# Patient Record
Sex: Female | Born: 1995 | Race: White | Hispanic: No | Marital: Married | Smoking: Never smoker
Health system: Southern US, Community
[De-identification: ages and names within clinical notes are randomized; demographics above are authoritative.]

## PROBLEM LIST (undated history)

## (undated) ENCOUNTER — Inpatient Hospital Stay: Payer: Self-pay

## (undated) DIAGNOSIS — K5900 Constipation, unspecified: Secondary | ICD-10-CM

## (undated) HISTORY — PX: KNEE SURGERY: SHX244

## (undated) HISTORY — PX: CHOLECYSTECTOMY: SHX55

---
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
## 2017-02-22 NOTE — L&D Delivery Note (Signed)
Delivery Note  First Stage: Induction: Cytotec, Cook Cath, Pitocin, AROM Analgesia /Anesthesia intrapartum: epidural AROM at 0105  Second Stage: Complete dilation at 1123 Onset of pushing at 1123 FHR second stage Cat II, occasional variable decels  Delivery of a viable female at 1219 on 10/25/2017  by CNM delivery of fetal head in ROA position with restitution to ROT. No nuchal cord;  Anterior then posterior shoulders delivered easily with gentle downward traction. Baby placed on mom's chest, and attended to by peds.  Cord double clamped after cessation of pulsation, cut by FOB   Third Stage: Placenta delivered Spontaneously intact with 3VC @ 1230 Placenta disposition: routine disposal Uterine tone: firm with massage, bleeding small. I/O cath for 41ml urine and Cytotec PR given due to increased PPH risk with IOL.   1st deg perineal and right periurethral laceration identified  Anesthesia for repair: epidural Repair: 2-0 vicryl CT-1 and 3-0 vicryl SH Est. Blood Loss (mL): 250  Complications: none  Mom to postpartum.  Baby to Couplet care / Skin to Skin.  Newborn: Birth Weight:  7 lb 1.9 oz  Apgar Scores: 7/9 Feeding planned: breast and formula

## 2017-04-06 ENCOUNTER — Emergency Department: Payer: BC Managed Care – PPO

## 2017-04-06 ENCOUNTER — Other Ambulatory Visit: Payer: Self-pay

## 2017-04-06 ENCOUNTER — Emergency Department
Admission: EM | Admit: 2017-04-06 | Discharge: 2017-04-06 | Disposition: A | Discharge status: Home / Self Care | Payer: BC Managed Care – PPO | Attending: Emergency Medicine | Admitting: Emergency Medicine

## 2017-04-06 ENCOUNTER — Encounter: Payer: Self-pay | Admitting: Emergency Medicine

## 2017-04-06 DIAGNOSIS — S80811A Abrasion, right lower leg, initial encounter: Secondary | ICD-10-CM | POA: Insufficient documentation

## 2017-04-06 DIAGNOSIS — Y929 Unspecified place or not applicable: Secondary | ICD-10-CM | POA: Insufficient documentation

## 2017-04-06 DIAGNOSIS — S93492A Sprain of other ligament of left ankle, initial encounter: Secondary | ICD-10-CM | POA: Diagnosis not present

## 2017-04-06 DIAGNOSIS — W109XXA Fall (on) (from) unspecified stairs and steps, initial encounter: Secondary | ICD-10-CM | POA: Insufficient documentation

## 2017-04-06 DIAGNOSIS — Z3A10 10 weeks gestation of pregnancy: Secondary | ICD-10-CM | POA: Insufficient documentation

## 2017-04-06 DIAGNOSIS — Y939 Activity, unspecified: Secondary | ICD-10-CM | POA: Insufficient documentation

## 2017-04-06 DIAGNOSIS — Y998 Other external cause status: Secondary | ICD-10-CM | POA: Insufficient documentation

## 2017-04-06 DIAGNOSIS — Z880 Allergy status to penicillin: Secondary | ICD-10-CM | POA: Insufficient documentation

## 2017-04-06 DIAGNOSIS — S99912A Unspecified injury of left ankle, initial encounter: Secondary | ICD-10-CM | POA: Diagnosis present

## 2017-04-06 NOTE — ED Triage Notes (Signed)
Pt to triage via w/c with no distress noted; to walking down steps and fell injuring left ankle; abrasion noted to right lower leg; denies any other c/o; pt approx [redacted]wks pregnant

## 2017-04-06 NOTE — ED Provider Notes (Signed)
[facility] EMERGENCY DEPARTMENT Provider Note   CSN: [id number] Arrival date & time: 04/06/17  2127     History   Chief Complaint Chief Complaint  Patient presents with  . Ankle Pain    HPI Regina Baker is a 22 y.o. female presents to the emergency department for evaluation of left ankle pain.  Patient is [redacted] weeks pregnant.  She denies any injury to her abdomen back, chest head or neck.  She states she only rolled her left ankle and is only having pain in the left ankle.  She did suffer mild abrasions to the right shin but she is not having the right lower extremity discomfort.  The pain is mild she has not attempted weightbearing she has had moderate swelling throughout the left lateral ankle.  Denies any vaginal bleeding, discharge.  HPI  Past Medical History:  Diagnosis Date  . Constipation     There are no active problems to display for this patient.   Past Surgical History:  Procedure Laterality Date  . KNEE SURGERY Right     OB History    Gravida Para Term Preterm AB Living   1             SAB TAB Ectopic Multiple Live Births                   Home Medications    Prior to Admission medications   Not on File    Family History No family history on file.  Social History Social History   Tobacco Use  . Smoking status: Never Smoker  Substance Use Topics  . Alcohol use: No  . Drug use: Not on file     Allergies   Penicillins   Review of Systems Review of Systems  Constitutional: Negative.   Cardiovascular: Negative for chest pain and leg swelling.  Gastrointestinal: Negative for abdominal pain.  Musculoskeletal: Positive for gait problem and joint swelling. Negative for back pain and neck pain.  Skin: Negative for color change, rash and wound.  Neurological: Negative for dizziness, syncope and weakness.  Psychiatric/Behavioral: Negative for confusion and hallucinations.  All other systems reviewed and are  negative.    Physical Exam Updated Vital Signs BP 138/80 (BP Location: Left Arm)   Pulse 86   Temp 98 F (36.7 C) (Oral)   Resp 18   Ht 5\' 7"  (1.702 m)   Wt 96.6 kg (213 lb)   SpO2 100%   BMI 33.36 kg/m   Physical Exam  Constitutional: She is oriented to person, place, and time. She appears well-developed and well-nourished. No distress.  HENT:  Head: Normocephalic and atraumatic.  Eyes: EOM are normal. Pupils are equal, round, and reactive to light.  Neck: Normal range of motion. Neck supple.  Cardiovascular: Normal rate and regular rhythm.  Pulmonary/Chest: No respiratory distress.  Musculoskeletal:       Left ankle: She exhibits decreased range of motion, swelling and ecchymosis. She exhibits no deformity, no laceration and normal pulse. Tenderness. Lateral malleolus and AITFL tenderness found. Achilles tendon exhibits no pain, no defect and normal Thompson's test results.  Examination of the left lateral ankle shows patient has swelling and tenderness on the lateral malleolus.  She has good ankle plantar flexion dorsiflexion.  She is nontender throughout the foot or calcaneus.  Ankle stable.  She is nontender along the proximal tib-fib region or knee.  Neurovascular intact left lower extremity.  Neurological: She is alert and oriented to  person, place, and time.  Skin: Skin is warm and dry.  Psychiatric: She has a normal mood and affect. Her behavior is normal. Judgment and thought content normal.     ED Treatments / Results  Labs (all labs ordered are listed, but only abnormal results are displayed) Labs Reviewed - No data to display  EKG  EKG Interpretation None       Radiology Dg Ankle Complete Left  Result Date: 04/06/2017 CLINICAL DATA:  Left ankle pain and swelling. EXAM: LEFT ANKLE COMPLETE - 3+ VIEW COMPARISON:  None. FINDINGS: There is no evidence of acute fracture nor joint dislocation. Base of fifth metatarsal appears intact. No widening of the medial  clear space of the ankle joint. Small ankle joint effusion is noted. There is no evidence of arthropathy or other focal bone abnormality. There soft tissue swelling over the lateral malleolus and anterior soft tissues. IMPRESSION: Soft tissue swelling over the anterior and lateral aspect of the ankle joint. Small ankle joint effusion. No fracture. Electronically Signed   By: Tollie Eth M.D.   On: 04/06/2017 23:11    Procedures .Splint Application Date/Time: 04/06/2017 11:27 PM Performed by: Evon Slack, PA-C Authorized by: Evon Slack, PA-C   Consent:    Consent obtained:  Verbal   Consent given by:  Patient   Alternatives discussed:  No treatment Pre-procedure details:    Sensation:  Normal Procedure details:    Laterality:  Left   Location:  Ankle   Ankle:  L ankle   Splint type:  Ankle stirrup   Supplies:  Prefabricated splint Post-procedure details:    Pain:  Improved   Sensation:  Normal   Patient tolerance of procedure:  Tolerated well, no immediate complications Comments:     Patient also given crutches.   (including critical care time)  Medications Ordered in ED Medications - No data to display   Initial Impression / Assessment and Plan / ED Course  I have reviewed the triage vital signs and the nursing notes.  Pertinent labs & imaging results that were available during my care of the patient were reviewed by me and considered in my medical decision making (see chart for details).     22 year old female with left lateral ankle pain from inversion injury to the ankle.  She suffered lateral soft tissue swelling and pain.  X-rays reviewed by me today show no evidence of acute bony abnormality.  She is placed into a stirrup splint, she will rest ice and elevate she is given crutches.  She is educated on signs and symptoms to follow-up for.  Final Clinical Impressions(s) / ED Diagnoses   Final diagnoses:  Sprain of anterior talofibular ligament of left  ankle, initial encounter    ED Discharge Orders    None       Ronnette Juniper 04/06/17 2328    Myrna Blazer, MD 04/07/17 [phone]

## 2017-04-06 NOTE — ED Notes (Signed)
Pt is approx [redacted] weeks pregnant.  Pt slipped and fell down 4 steps outside today.  Pt has swelling and pain to left ankle.  Pt has abrasions to right lower leg and right knee.  No abd pain.  No vag bleeding.  Pt alert.

## 2017-04-06 NOTE — Discharge Instructions (Signed)
Please wear ankle stirrup splint, rest ice and elevate the left ankle as much as possible.  Use crutches as needed for weightbearing.  You may bear weight once you are able to ambulate with no limp.  Take Tylenol as needed for pain.

## 2017-04-06 NOTE — ED Notes (Signed)

## 2017-04-06 NOTE — ED Notes (Signed)
Pt refused xray per xray tech.

## 2017-04-20 ENCOUNTER — Other Ambulatory Visit: Payer: Self-pay | Admitting: Certified Nurse Midwife

## 2017-04-20 DIAGNOSIS — Z369 Encounter for antenatal screening, unspecified: Secondary | ICD-10-CM

## 2017-04-28 ENCOUNTER — Ambulatory Visit (HOSPITAL_BASED_OUTPATIENT_CLINIC_OR_DEPARTMENT_OTHER)
Admission: RE | Admit: 2017-04-28 | Discharge: 2017-04-28 | Disposition: A | Discharge status: Home / Self Care | Payer: BC Managed Care – PPO | Source: Ambulatory Visit | Attending: Maternal & Fetal Medicine | Admitting: Maternal & Fetal Medicine

## 2017-04-28 ENCOUNTER — Ambulatory Visit
Admission: RE | Admit: 2017-04-28 | Discharge: 2017-04-28 | Disposition: A | Discharge status: Home / Self Care | Payer: BC Managed Care – PPO | Source: Ambulatory Visit | Attending: Certified Nurse Midwife | Admitting: Certified Nurse Midwife

## 2017-04-28 ENCOUNTER — Ambulatory Visit: Payer: BC Managed Care – PPO

## 2017-04-28 VITALS — BP 129/77 | HR 89 | Temp 98.2°F | Resp 18 | Wt 216.4 lb

## 2017-04-28 DIAGNOSIS — Z3A13 13 weeks gestation of pregnancy: Secondary | ICD-10-CM | POA: Diagnosis not present

## 2017-04-28 DIAGNOSIS — Z369 Encounter for antenatal screening, unspecified: Secondary | ICD-10-CM | POA: Diagnosis not present

## 2017-04-28 DIAGNOSIS — Z3401 Encounter for supervision of normal first pregnancy, first trimester: Secondary | ICD-10-CM | POA: Diagnosis not present

## 2017-04-28 NOTE — Progress Notes (Addendum)
Referring physician:  [facility] Length of Consultation: 30 minutes   Regina Baker  was referred to [facility] [place] for genetic counseling to review prenatal screening and testing options.  This note summarizes the information we discussed.    We offered the following routine screening tests for this pregnancy:  First trimester screening, which includes nuchal translucency ultrasound screen and first trimester maternal serum marker screening.  The nuchal translucency has approximately an 80% detection rate for Down syndrome and can be positive for other chromosome abnormalities as well as congenital heart defects.  When combined with a maternal serum marker screening, the detection rate is up to 90% for Down syndrome and up to 97% for trisomy 18.     Maternal serum marker screening, a blood test that measures pregnancy proteins, can provide risk assessments for Down syndrome, trisomy 18, and open neural tube defects (spina bifida, anencephaly). Because it does not directly examine the fetus, it cannot positively diagnose or rule out these problems.  Targeted ultrasound uses high frequency sound waves to create an image of the developing fetus.  An ultrasound is often recommended as a routine means of evaluating the pregnancy.  It is also used to screen for fetal anatomy problems (for example, a heart defect) that might be suggestive of a chromosomal or other abnormality.   Should these screening tests indicate an increased concern, then the following additional testing options would be offered:  The chorionic villus sampling procedure is available for first trimester chromosome analysis.  This involves the withdrawal of a small amount of chorionic villi (tissue from the developing placenta).  Risk of pregnancy loss is estimated to be approximately 1 in 200 to 1 in 100 (0.5 to 1%).  There is approximately a 1% (1 in 100) chance that the CVS chromosome results will  be unclear.  Chorionic villi cannot be tested for neural tube defects.     Amniocentesis involves the removal of a small amount of amniotic fluid from the sac surrounding the fetus with the use of a thin needle inserted through the maternal abdomen and uterus.  Ultrasound guidance is used throughout the procedure.  Fetal cells from amniotic fluid are directly evaluated and > 99.5% of chromosome problems and > 98% of open neural tube defects can be detected. This procedure is generally performed after the 15th week of pregnancy.  The main risks to this procedure include complications leading to miscarriage in less than 1 in 200 cases (0.5%).  As another option for information if the pregnancy is suspected to be an an increased chance for certain chromosome conditions, we also reviewed the availability of cell free fetal DNA testing from maternal blood to determine whether or not the baby may have either Down syndrome, trisomy 80, or trisomy 62.  This test utilizes a maternal blood sample and DNA sequencing technology to isolate circulating cell free fetal DNA from maternal plasma.  The fetal DNA can then be analyzed for DNA sequences that are derived from the three most common chromosomes involved in aneuploidy, chromosomes 13, 18, and 21.  If the overall amount of DNA is greater than the expected level for any of these chromosomes, aneuploidy is suspected.  While we do not consider it a replacement for invasive testing and karyotype analysis, a negative result from this testing would be reassuring, though not a guarantee of a normal chromosome complement for the baby.  An abnormal result is certainly suggestive of an abnormal chromosome complement, though  we would still recommend CVS or amniocentesis to confirm any findings from this testing.  Cystic Fibrosis and Spinal Muscular Atrophy (SMA) screening were also discussed with the patient. Both conditions are recessive, which means that both parents must be  carriers in order to have a child with the disease.  Cystic fibrosis (CF) is one of the most common genetic conditions in persons of Caucasian ancestry.  This condition occurs in approximately 1 in 2,500 Caucasian persons and results in thickened secretions in the lungs, digestive, and reproductive systems.  For a baby to be at risk for having CF, both of the parents must be carriers for this condition.  Approximately 1 in 7325 Caucasian persons is a carrier for CF.  Current carrier testing looks for the most common mutations in the gene for CF and can detect approximately 90% of carriers in the Caucasian population.  This means that the carrier screening can greatly reduce, but cannot eliminate, the chance for an individual to have a child with CF.  If an individual is found to be a carrier for CF, then carrier testing would be available for the partner. As part of [place]'s newborn screening profile, all babies born in the state of [place] will have a two-tier screening process.  Specimens are first tested to determine the concentration of immunoreactive trypsinogen (IRT).  The top 5% of specimens with the highest IRT values then undergo DNA testing using a panel of over 40 common CF mutations. SMA is a neurodegenerative disorder that leads to atrophy of skeletal muscle and overall weakness.  This condition is also more prevalent in the Caucasian population, with 1 in 40-1 in 60 persons being a carrier and 1 in 6,000-1 in 10,000 children being affected.  There are multiple forms of the disease, with some causing death in infancy to other forms with survival into adulthood.  The genetics of SMA is complex, but carrier screening can detect up to 95% of carriers in the Caucasian population.  Similar to CF, a negative result can greatly reduce, but cannot eliminate, the chance to have a child with SMA.  We obtained a detailed family history and pregnancy history.  The patient reported that her mother and  maternal grandmother both have been diagnosed with thyroid cancer.  The grandmother was in her 4160s and the mother in her 9440s.  There are no other family members with cancer.  We discussed that most cancers occur by chance, though there are some families in which predisposition to cancer can be inherited and can increase the risk for certain types of cancer.  If the family is concerned about this history, we would provide contact information about a cancer genetic counselor.  The remainder of the family history was reported to be unremarkable for birth defects, intellectual delays, recurrent pregnancy loss or known chromosome abnormalities.  Regina Baker stated that this is her first pregnancy.  She reported no complications or exposures in this pregnancy that would be expected to increase the risk for birth defects.  After consideration of the options, Regina Baker elected to proceed with first trimester screening.  She declined carrier testing for CF and SMA.  An ultrasound was performed at the time of the visit.  The gestational age was consistent with 13 weeks.  Fetal anatomy could not be assessed due to early gestational age.  Please refer to the ultrasound report for details of that study.  Regina Baker was encouraged to call with questions or concerns.  We  can be contacted at [phone].  Labs drawn:  First trimester screening  Cherly Anderson, MS, CGC  I was immediately available and supervising. Argentina Ponder, MD [facility]

## 2017-05-05 ENCOUNTER — Telehealth: Payer: Self-pay | Admitting: Obstetrics and Gynecology

## 2017-05-05 NOTE — Telephone Encounter (Signed)
The results of the First Trimester Nuchal Translucency and Biochemical Screening are now available.  These results showed an increased risk for Down Syndrome.  Specifically, the risk for Down syndrome is now 1 in 98103 (the prior risk based upon age alone was 1 in 1,107).  The risk for Trisomy 13/18 is estimated to be 1 in >10,000.  As we discussed when we called with these results, if more definitive information is desired, we would offer chorionic villus sampling or amniocentesis.  Because we do not yet know the effectiveness of combined first and second trimester screening, we do not recommend a maternal serum screen to assess the chance for chromosome conditions.  However, if screening for neural tube defects is desired, maternal serum screening for AFP only can be performed between 15 and [redacted] weeks gestation.    We also reviewed the availability of cell free fetal DNA testing from maternal blood to determine whether or not the baby may have either Down syndrome, trisomy 9113, or trisomy 5518.  This test utilizes a maternal blood sample and DNA sequencing technology to isolate circulating cell free fetal DNA from maternal plasma.  The fetal DNA can then be analyzed for DNA sequences that are derived from the three most common chromosomes involved in aneuploidy, chromosomes 13, 18, and 21.  If the overall amount of DNA is greater than the expected level for any of these chromosomes, aneuploidy is suspected.  While we do not consider it a replacement for invasive testing and karyotype analysis, a negative result from this testing would be reassuring, though not a guarantee of a normal chromosome complement for the baby.  An abnormal result is certainly suggestive of an abnormal chromosome complement, though we would still recommend CVS or amniocentesis to confirm any findings from this testing  The patient said that she needed to check her work schedule and get back with us about a time for a follow up visit to  discuss these testing options. We offered a genetic counseling only visit at her convenience on Mondays or Thursdays. I called again this afternoon to double check and she was not available.  The patient was encouraged to contact us with any questions.  We may be reached at [phone].   Cherly Andersoneborah F. Noreen Mackintosh, MS, CGC

## 2017-05-19 ENCOUNTER — Telehealth: Payer: Self-pay | Admitting: Obstetrics and Gynecology

## 2017-05-19 NOTE — Telephone Encounter (Signed)
I spoke with Ms. Lesly DukesStutts previously on 05/05/2017 and provided results of first trimester screening which showed an increased risk for Down syndrome of 1 in 103.  She stated at that time that she would check her work schedule and call us back for an appointment.  We have not yet heard back.  I called the grandmother's contact number today to reach the patient and she was not available.  I asked her to let Revonda Standardllison know that I called and ask her to call me back to follow up.  Cherly Andersoneborah F. Margan Elias, MS, CGC

## 2017-08-19 ENCOUNTER — Observation Stay
Admission: EM | Admit: 2017-08-19 | Discharge: 2017-08-19 | Disposition: A | Discharge status: Home / Self Care | Payer: Medicaid Other | Attending: Obstetrics and Gynecology | Admitting: Obstetrics and Gynecology

## 2017-08-19 ENCOUNTER — Other Ambulatory Visit: Payer: Self-pay

## 2017-08-19 DIAGNOSIS — Z6833 Body mass index (BMI) 33.0-33.9, adult: Secondary | ICD-10-CM | POA: Diagnosis not present

## 2017-08-19 DIAGNOSIS — Z880 Allergy status to penicillin: Secondary | ICD-10-CM | POA: Diagnosis not present

## 2017-08-19 DIAGNOSIS — R109 Unspecified abdominal pain: Secondary | ICD-10-CM | POA: Diagnosis not present

## 2017-08-19 DIAGNOSIS — Z3A29 29 weeks gestation of pregnancy: Secondary | ICD-10-CM | POA: Insufficient documentation

## 2017-08-19 DIAGNOSIS — Z7982 Long term (current) use of aspirin: Secondary | ICD-10-CM | POA: Insufficient documentation

## 2017-08-19 DIAGNOSIS — O26893 Other specified pregnancy related conditions, third trimester: Secondary | ICD-10-CM | POA: Diagnosis present

## 2017-08-19 DIAGNOSIS — E669 Obesity, unspecified: Secondary | ICD-10-CM | POA: Insufficient documentation

## 2017-08-19 LAB — CHLAMYDIA/NGC RT PCR (ARMC ONLY)
Chlamydia Tr: NOT DETECTED
N gonorrhoeae: NOT DETECTED

## 2017-08-19 LAB — URINALYSIS, COMPLETE (UACMP) WITH MICROSCOPIC
BILIRUBIN URINE: NEGATIVE
Glucose, UA: NEGATIVE mg/dL
KETONES UR: NEGATIVE mg/dL
LEUKOCYTES UA: NEGATIVE
NITRITE: NEGATIVE
Protein, ur: NEGATIVE mg/dL
SPECIFIC GRAVITY, URINE: 1.005 (ref 1.005–1.030)
pH: 7 (ref 5.0–8.0)

## 2017-08-19 LAB — WET PREP, GENITAL
Clue Cells Wet Prep HPF POC: NONE SEEN
SPERM: NONE SEEN
TRICH WET PREP: NONE SEEN
Yeast Wet Prep HPF POC: NONE SEEN

## 2017-08-19 NOTE — Discharge Summary (Signed)
Regina Baker is a 22 y.o. female. She is at [redacted]w[redacted]d gestation. No LMP recorded. Patient is pregnant. Estimated Date of Delivery: 11/01/17  Prenatal care site: [facility]   Current pregnancy complicated by:  1. CHTN- no medications currently, taking baby ASA daily 2. Obesity, BMI 33  Chief complaint: lower abdominal pain, worse with maternal and fetal movements.   Location: lower abdomen and some LBP.  Onset/timing: pain started yesterday and worsened overnight.  Associated symptoms: denies flank pain, dysuria. Some vaginal discharge- no itching or burning.     S: Resting comfortably. no CTX, no VB.no LOF,  Active fetal movement. Denies: HA, visual changes, SOB, or RUQ/epigastric pain  Maternal Medical History:   Past Medical History:  Diagnosis Date  . Constipation     Past Surgical History:  Procedure Laterality Date  . KNEE SURGERY Right     Allergies  Allergen Reactions  . Penicillins Rash    Prior to Admission medications   Medication Sig Start Date End Date Taking? Authorizing Provider  aspirin 81 MG chewable tablet Chew by mouth daily.    [provider]  Prenatal Vit-Fe Fumarate-FA (PRENATAL MULTIVITAMIN) TABS tablet Take 1 tablet by mouth daily at 12 noon.    [provider]      Social History: She  reports that she has never smoked. She has never used smokeless tobacco. She reports that she does not drink alcohol or use drugs.  Family History: no history of gyn cancers  Review of Systems: A full review of systems was performed and negative except as noted in the HPI.     O:  BP 124/78 (BP Location: Left Arm)   Pulse 96   Temp 98 F (36.7 C) (Oral)   Resp 16   Ht 5\' 7"  (1.702 m)   Wt 214 lb (97.1 kg)   BMI 33.52 kg/m  Results for orders placed or performed during the hospital encounter of 08/19/17 (from the past 48 hour(s))  Urinalysis, Complete w Microscopic   Collection Time: 08/19/17  9:55 AM  Result Value Ref  Range   Color, Urine STRAW (A) YELLOW   APPearance CLEAR (A) CLEAR   Specific Gravity, Urine 1.005 1.005 - 1.030   pH 7.0 5.0 - 8.0   Glucose, UA NEGATIVE NEGATIVE mg/dL   Hgb urine dipstick SMALL (A) NEGATIVE   Bilirubin Urine NEGATIVE NEGATIVE   Ketones, ur NEGATIVE NEGATIVE mg/dL   Protein, ur NEGATIVE NEGATIVE mg/dL   Nitrite NEGATIVE NEGATIVE   Leukocytes, UA NEGATIVE NEGATIVE   RBC / HPF 0-5 0 - 5 RBC/hpf   WBC, UA 0-5 0 - 5 WBC/hpf   Bacteria, UA FEW (A) NONE SEEN   Squamous Epithelial / LPF 6-10 0 - 5   Mucus PRESENT      Constitutional: NAD, AAOx3  HE/ENT: extraocular movements grossly intact, moist mucous membranes CV: RRR PULM: nl respiratory effort, CTABL     Abd: gravid, non-tender, non-distended, soft      Ext: Non-tender, Nonedematous   Psych: mood appropriate, speech normal Pelvic: SSE done- cervix long and closed. Not friable, no CMT. Vaginal dc noted- scant white.   Fetal  monitoring: NST: Cat 1 Appropriate for GA Baseline: 140bpm Variability: moderate Accelerations: present x >2 Decelerations absent    A/P: 22 y.o. [redacted]w[redacted]d here for antenatal surveillance for abdominal pain in pregnancy, dx: :Ligament pain/stretching.  Preterm labor: not present.   discussed comfort measures for ligament pain including tylenol prn and support belt.  Wet prep: negative  Pending GC/CT and FFN.   Reactive NST   D/c home stable, precautions reviewed, follow-up as scheduled.    McVey, REBECCA A, CNM 08/19/2017  10:47 AM

## 2017-08-19 NOTE — OB Triage Note (Signed)
Pt c/o cramping that started around 3am. She denies any vaginal bleeding, no discharge, no leaking fluid. She still feels baby move, last BM was yesterday, last intercourse was a few weeks ago. She is ahving some frequency with urination. Pain is in her back and lower abdomen. It comes and goes.

## 2017-08-20 LAB — URINE CULTURE

## 2017-09-07 IMAGING — CT CT RENAL STONE PROTOCOL
1 of 2 series · 15 of 32 positions shown, 19 images · non-contrast
Comparison: Abdomen and pelvis radiographs dated [DATE] [DATE].

CLINICAL DATA: Mid abdominal pain radiating to the lower back.
Gross hematuria and microscopic hematuria. No known injury.

EXAM:
CT ABDOMEN AND PELVIS WITHOUT CONTRAST
TECHNIQUE: Multidetector CT imaging of the abdomen and pelvis was performed
following the standard protocol without IV contrast.

[Series 2: axial st · axial · 0.74mm/px · z∈[-1036,-596]mm · 15 of 98 slices shown, 19 images]
[im 5/98  soft-tissue]
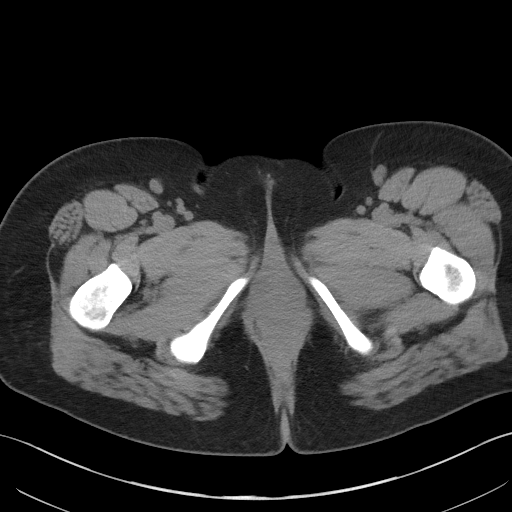
[im 5/98  bone]
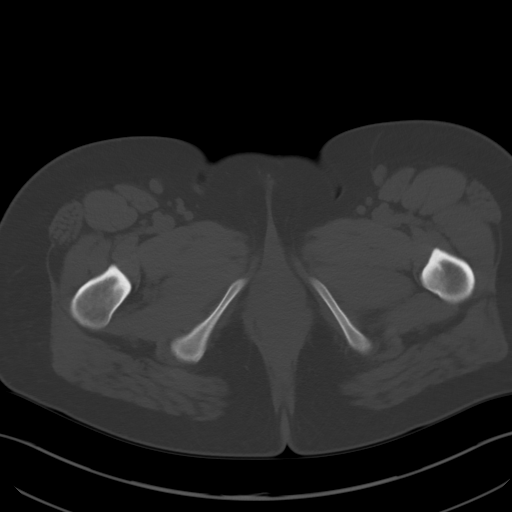
[im 13/98  soft-tissue]
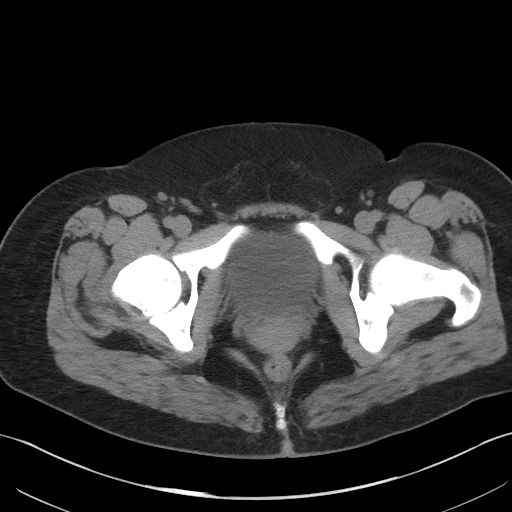
[im 22/98  soft-tissue]
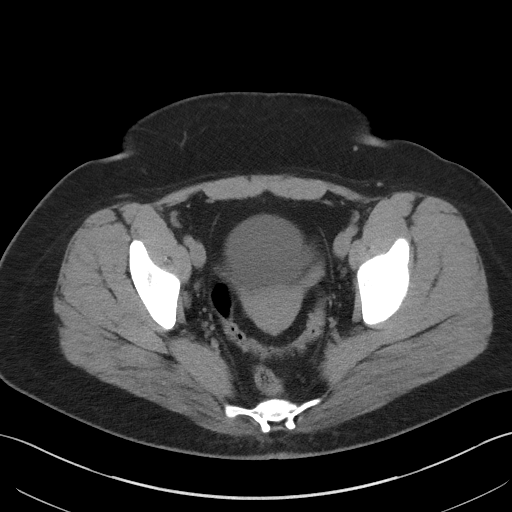
[im 26/98  soft-tissue]
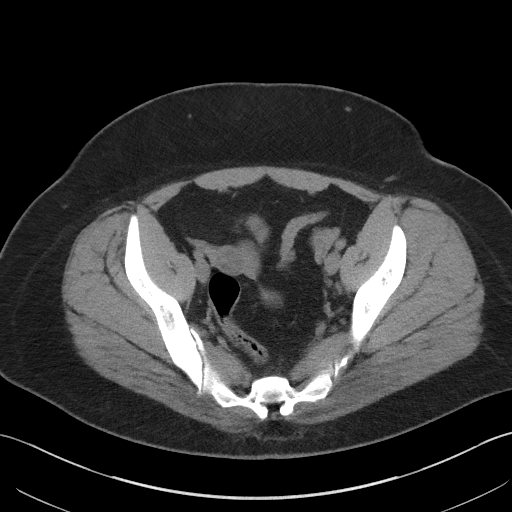
[im 34/98  soft-tissue]
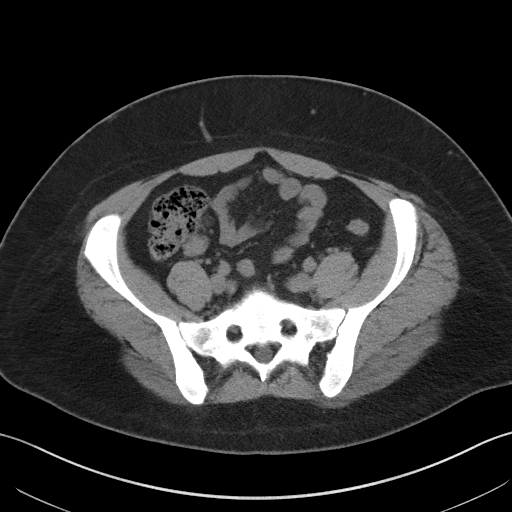
[im 43/98  soft-tissue]
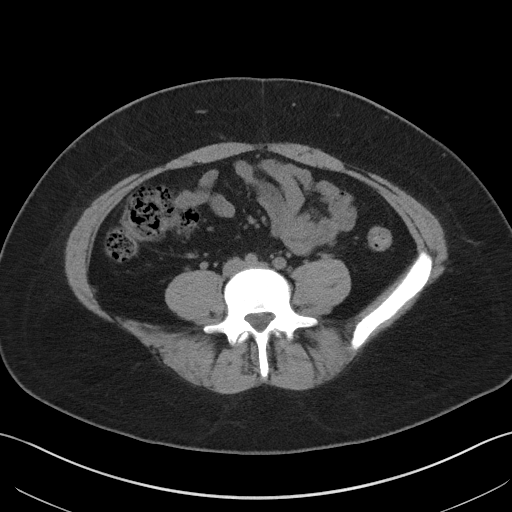
[im 51/98  soft-tissue]
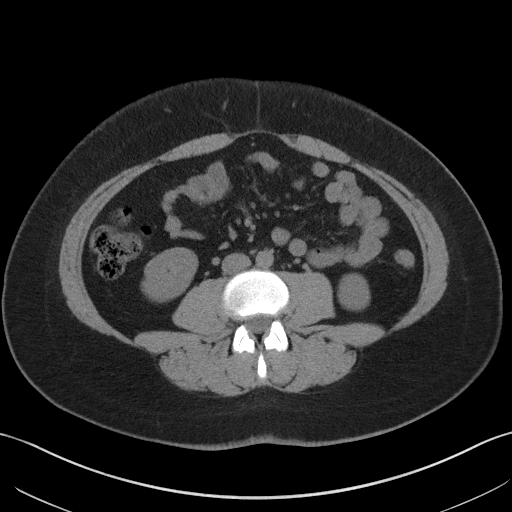
[im 55/98  soft-tissue]
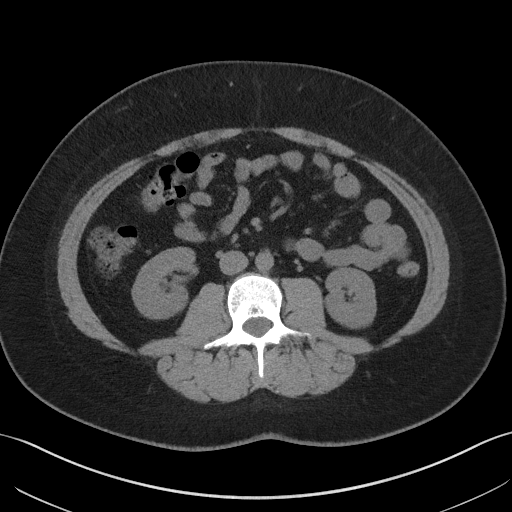
[im 64/98  soft-tissue]
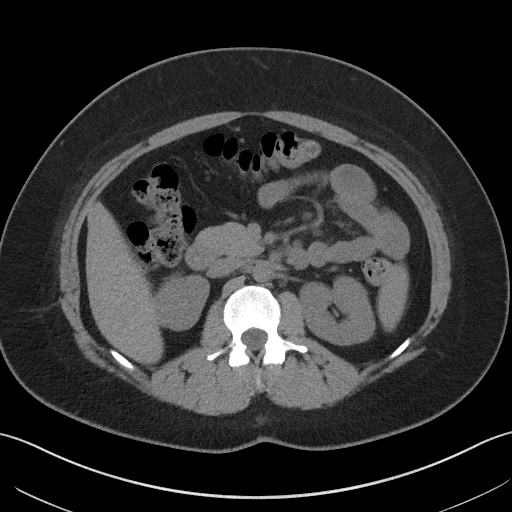
[im 64/98  bone]
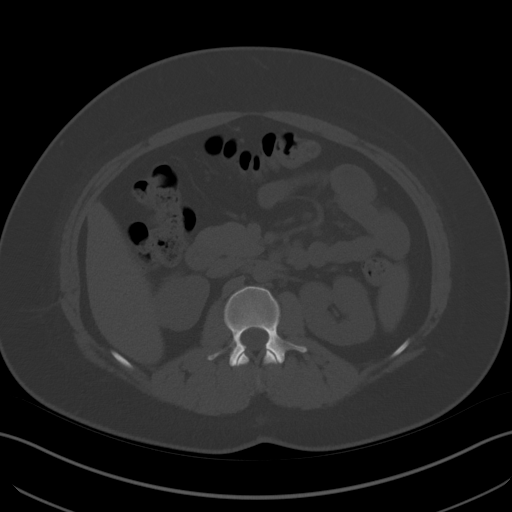
[im 72/98  soft-tissue]
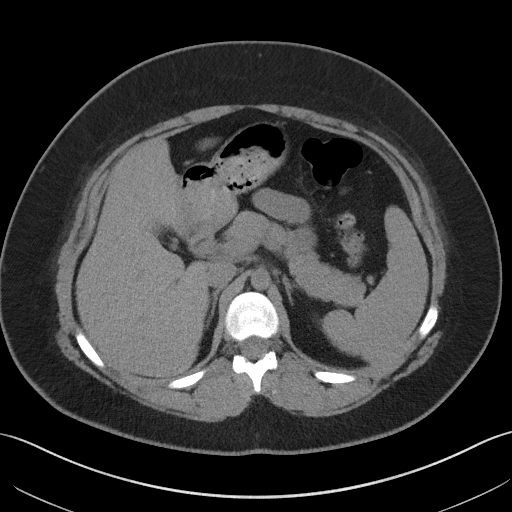
[im 76/98  soft-tissue]
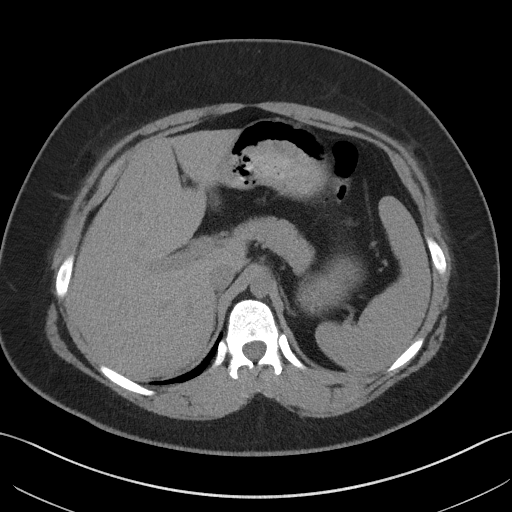
[im 81/98  lung]
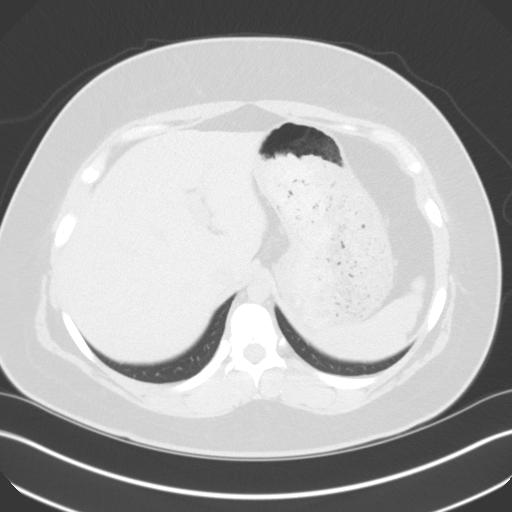
[im 85/98  soft-tissue]
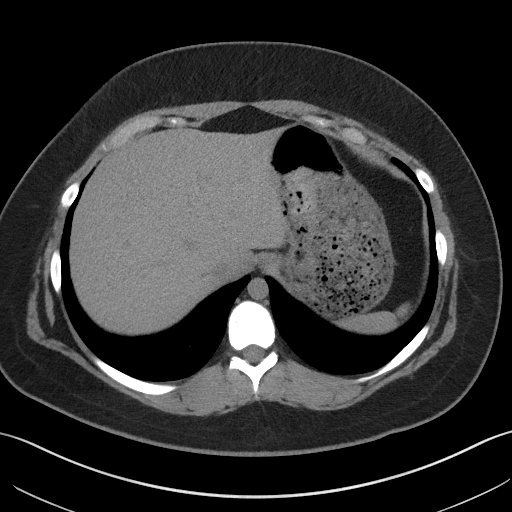
[im 85/98  lung]
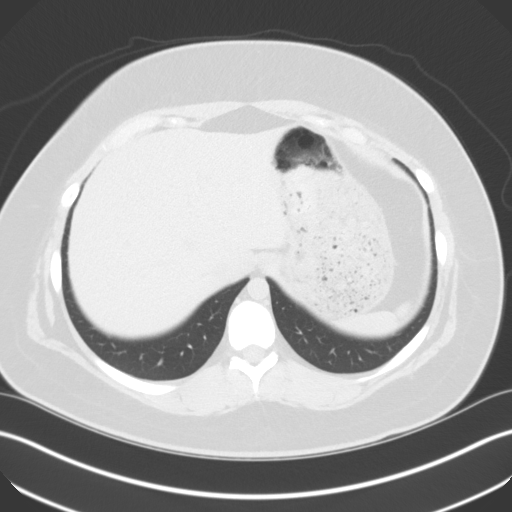
[im 89/98  lung]
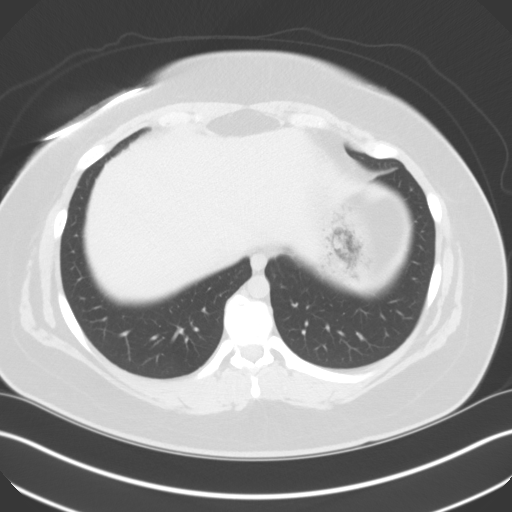
[im 93/98  soft-tissue]
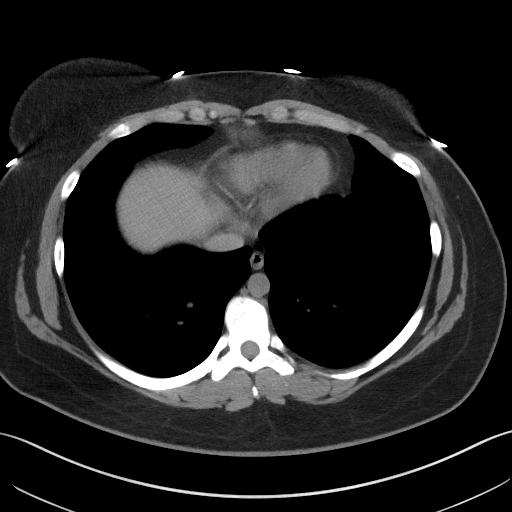
[im 93/98  lung]
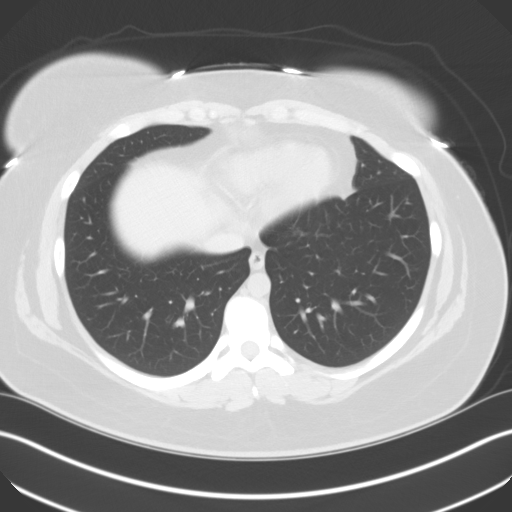

[15 of 32 positions shown; findings below may reference images not displayed]

FINDINGS: Lower chest:  No acute findings.

Hepatobiliary: No mass visualized on this un-enhanced exam.

Pancreas: No mass or inflammatory process identified on this
un-enhanced exam.

Spleen: Within normal limits in size.

Adrenals/Urinary Tract: No evidence of urolithiasis or
hydronephrosis. No definite mass visualized on this un-enhanced
exam.

Stomach/Bowel: No visible gastrointestinal abnormalities. Normal
appearing appendix.

Vascular/Lymphatic: No pathologically enlarged lymph nodes. No
evidence of abdominal aortic aneurysm.

Reproductive: No mass or other significant abnormality.

Other: None.

Musculoskeletal:  Mild lower thoracic spine degenerative changes.
IMPRESSION: No acute abnormality.  No urinary tract calculi or hydronephrosis.

## 2017-10-04 ENCOUNTER — Observation Stay
Admission: EM | Admit: 2017-10-04 | Discharge: 2017-10-04 | Disposition: A | Discharge status: Home / Self Care | Payer: Medicaid Other | Attending: Obstetrics and Gynecology | Admitting: Obstetrics and Gynecology

## 2017-10-04 ENCOUNTER — Encounter: Payer: Self-pay | Admitting: *Deleted

## 2017-10-04 DIAGNOSIS — R42 Dizziness and giddiness: Secondary | ICD-10-CM | POA: Insufficient documentation

## 2017-10-04 DIAGNOSIS — O26893 Other specified pregnancy related conditions, third trimester: Principal | ICD-10-CM | POA: Insufficient documentation

## 2017-10-04 DIAGNOSIS — O163 Unspecified maternal hypertension, third trimester: Secondary | ICD-10-CM | POA: Diagnosis not present

## 2017-10-04 DIAGNOSIS — Z3A36 36 weeks gestation of pregnancy: Secondary | ICD-10-CM | POA: Diagnosis not present

## 2017-10-04 DIAGNOSIS — Z3490 Encounter for supervision of normal pregnancy, unspecified, unspecified trimester: Secondary | ICD-10-CM

## 2017-10-04 LAB — URINALYSIS, ROUTINE W REFLEX MICROSCOPIC
Bilirubin Urine: NEGATIVE
Glucose, UA: NEGATIVE mg/dL
KETONES UR: NEGATIVE mg/dL
Leukocytes, UA: NEGATIVE
Nitrite: NEGATIVE
PROTEIN: NEGATIVE mg/dL
Specific Gravity, Urine: 1.008 (ref 1.005–1.030)
pH: 7 (ref 5.0–8.0)

## 2017-10-04 LAB — PROTEIN / CREATININE RATIO, URINE
CREATININE, URINE: 60 mg/dL
Protein Creatinine Ratio: 0.17 mg/mg{Cre} — ABNORMAL HIGH (ref 0.00–0.15)
TOTAL PROTEIN, URINE: 10 mg/dL

## 2017-10-04 NOTE — Discharge Instructions (Signed)
Come back if:  Decreased fetal movement Heavy vaginal bleeding Big gush of fluid Temp over 100.4 Contractions every 3-5 min lasting at least one hour Rapid swelling in feet/hands Headache that doesn't resolve with tylenol Blurred vision or spots  Get plenty of rest and stay well hydrated! Go to next scheduled appt.

## 2017-10-04 NOTE — Progress Notes (Signed)
TRIAGE VISIT with NST   Regina Baker is a 22 y.o. G1P0. She is at 2024w0d gestation.  Indication: Dizziness with visual spots during dizzy spells, which started yesterday. Hx of cHTN without meds per early BP in pregnancy.   She was seen in the office today, with  BP <mild range.  S: Resting comfortably. no CTX, no VB. Active fetal movement. Denies headache, SOB, new onset swelling, RUQ pain. O:  BP 108/73   Pulse 94   Temp 97.6 F (36.4 C) (Oral)   Resp 16   Ht 5\' 7"  (1.702 m)   Wt 98 kg   BMI 33.83 kg/m  No results found for this or any previous visit (from the past 48 hour(s)).   Gen: NAD, AAOx3      Abd: FNTTP      Ext: Non-tender, Nonedmeatous   FHT: normal baseline, 15x15 accels, no decels, moderate variability TOCO: quiet SVE:  deferred  NST: Category I strip, see detailed evaluation above  A/P:  22 y.o. G1P0 1324w0d with dizzness and vidual spots at home in setting of chronic HTN                       Labor: not present.   R/o PreEclampsia: BP normal in triage, CBC today wnl. Will send P:C ratio.  Fetal Wellbeing: Reassuring Cat 1 tracing.  If P:C ratio wnl and BP continue to be <mild range, will D/c home with precautions reviewed, follow-up as scheduled.    Patient ID: Regina Baker, female   DOB: 08/24/1995, 22 y.o.   MRN: [id number]

## 2017-10-04 NOTE — OB Triage Note (Signed)
Recvd pt from ED. Pt had previously spoken to Dr. Dalbert GarnetBeasley and was told to come in to check blood pressures and evaluate pt. Pt c/o feeling dizzy and seeing spots that come and go. No LOF or vaginal bleeding. Pt feeling baby move well. Pt said BP at home 136/90

## 2017-10-05 NOTE — Discharge Summary (Signed)
Pt to triage with concerns for PreE with severe features from her phone call, with dizziness and visual changes  On presentation, BP in normal range with occasional low pressures. Labs in office today wnl.  UA on arrival negative for infection U:P ratio 170- normal NST: Reactive  Home with preE precautions, f/u in the office as scheduled. No other immediate explanation for dizziness, though may be venous stasis. Po hydration recommended.

## 2017-10-18 ENCOUNTER — Other Ambulatory Visit: Payer: Self-pay | Admitting: Certified Nurse Midwife

## 2017-10-22 ENCOUNTER — Other Ambulatory Visit: Payer: Self-pay

## 2017-10-22 ENCOUNTER — Inpatient Hospital Stay
Admission: EM | Admit: 2017-10-22 | Discharge: 2017-10-22 | Disposition: A | Discharge status: Home / Self Care | Payer: Medicaid Other | Attending: Obstetrics and Gynecology | Admitting: Obstetrics and Gynecology

## 2017-10-22 DIAGNOSIS — O163 Unspecified maternal hypertension, third trimester: Secondary | ICD-10-CM | POA: Insufficient documentation

## 2017-10-22 DIAGNOSIS — Z3A38 38 weeks gestation of pregnancy: Secondary | ICD-10-CM | POA: Diagnosis not present

## 2017-10-22 NOTE — OB Triage Note (Signed)
Patient here for NST being induced Monday morning at 0001. She complains of contractions that started today but cannot tell me how often she rates them a 4-10. Denies LOF or bleeding, has had some mucous like discharge.

## 2017-10-22 NOTE — Final Progress Note (Signed)
   Triage visit for NST   Regina Baker is a 22 y.o. G1P0. She is at 3365w4d gestation. She presents for a scheduled NST.  Indication: chronic HTN  S: Resting comfortably. no CTX, no VB. Active fetal movement. Concerned about  O:  BP 124/74   Pulse (!) 108   Temp (!) 97.1 F (36.2 C)  No results found for this or any previous visit (from the past 48 hour(s)).   Gen: NAD, AAOx3      Abd: FNTTP      Ext: Non-tender, Nonedmeatous    NST  Baseline: 135 Variability: moderate Accelerations present x >2 Decelerations absent Time 20mins  Interpretation: reactive NST, category 1 tracing  A/P:  22 y.o. G1P0 5365w4d with chronic HTN.    Reactive NST, with moderate variability and accelerations, no decels  Fetal Wellbeing: Reassuring  D/c home stable, precautions reviewed, follow-up as scheduled.     ----- Christeen DouglasBethany Ameyah Bangura, MD MPH Attending Obstetrician and Gynecologist [facility], Department of OB/GYN [facility]

## 2017-10-24 ENCOUNTER — Inpatient Hospital Stay
Admission: RE | Admit: 2017-10-24 | Discharge: 2017-10-27 | DRG: 807 | Disposition: A | Discharge status: Home / Self Care | Payer: Medicaid Other | Attending: Obstetrics & Gynecology | Admitting: Obstetrics & Gynecology

## 2017-10-24 ENCOUNTER — Other Ambulatory Visit: Payer: Self-pay

## 2017-10-24 ENCOUNTER — Encounter: Payer: Self-pay | Admitting: *Deleted

## 2017-10-24 DIAGNOSIS — Z3A38 38 weeks gestation of pregnancy: Secondary | ICD-10-CM

## 2017-10-24 DIAGNOSIS — O1002 Pre-existing essential hypertension complicating childbirth: Secondary | ICD-10-CM | POA: Diagnosis present

## 2017-10-24 DIAGNOSIS — E669 Obesity, unspecified: Secondary | ICD-10-CM | POA: Diagnosis present

## 2017-10-24 DIAGNOSIS — O99214 Obesity complicating childbirth: Secondary | ICD-10-CM | POA: Diagnosis present

## 2017-10-24 LAB — COMPREHENSIVE METABOLIC PANEL
ALBUMIN: 2.6 g/dL — AB (ref 3.5–5.0)
ALT: 11 U/L (ref 0–44)
ANION GAP: 10 (ref 5–15)
AST: 15 U/L (ref 15–41)
Alkaline Phosphatase: 166 U/L — ABNORMAL HIGH (ref 38–126)
BUN: 5 mg/dL — ABNORMAL LOW (ref 6–20)
CHLORIDE: 107 mmol/L (ref 98–111)
CO2: 19 mmol/L — AB (ref 22–32)
Calcium: 8.4 mg/dL — ABNORMAL LOW (ref 8.9–10.3)
Creatinine, Ser: 0.57 mg/dL (ref 0.44–1.00)
GFR calc Af Amer: >60 mL/min (ref >60)
GFR calc non Af Amer: >60 mL/min (ref >60)
GLUCOSE: 92 mg/dL (ref 70–99)
POTASSIUM: 3.7 mmol/L (ref 3.5–5.1)
SODIUM: 136 mmol/L (ref 135–145)
Total Bilirubin: 0.4 mg/dL (ref 0.3–1.2)
Total Protein: 6.1 g/dL — ABNORMAL LOW (ref 6.5–8.1)

## 2017-10-24 LAB — CBC
HEMATOCRIT: 34.4 % — AB (ref 35.0–47.0)
HEMOGLOBIN: 11.9 g/dL — AB (ref 12.0–16.0)
MCH: 28.8 pg (ref 26.0–34.0)
MCHC: 34.6 g/dL (ref 32.0–36.0)
MCV: 83.4 fL (ref 80.0–100.0)
Platelets: 233 10*3/uL (ref 150–440)
RBC: 4.12 MIL/uL (ref 3.80–5.20)
RDW: 14.7 % — ABNORMAL HIGH (ref 11.5–14.5)
WBC: 13.2 10*3/uL — AB (ref 3.6–11.0)

## 2017-10-24 LAB — TYPE AND SCREEN
ABO/RH(D): B POS
Antibody Screen: NEGATIVE

## 2017-10-24 LAB — PROTEIN / CREATININE RATIO, URINE
Creatinine, Urine: 145 mg/dL
PROTEIN CREATININE RATIO: 0.17 mg/mg{creat} — AB (ref 0.00–0.15)
Total Protein, Urine: 24 mg/dL

## 2017-10-24 MED ORDER — OXYTOCIN 40 UNITS IN LACTATED RINGERS INFUSION - SIMPLE MED
1.0000 m[IU]/min | INTRAVENOUS | Status: DC
  Administered 2017-10-24 – 2017-10-25 (×2): 2 m[IU]/min via INTRAVENOUS
  Administered 2017-10-25: 4 m[IU]/min via INTRAVENOUS
  Filled 2017-10-24: qty 1000

## 2017-10-24 MED ORDER — ZOLPIDEM TARTRATE 5 MG PO TABS
5.0000 mg | ORAL_TABLET | Freq: Every evening | ORAL | Status: DC | PRN
  Administered 2017-10-24: 5 mg via ORAL
  Filled 2017-10-24: qty 1

## 2017-10-24 MED ORDER — LACTATED RINGERS IV SOLN
500.0000 mL | INTRAVENOUS | Status: DC | PRN
  Administered 2017-10-25: 500 mL via INTRAVENOUS

## 2017-10-24 MED ORDER — LIDOCAINE HCL (PF) 1 % IJ SOLN
INTRAMUSCULAR | Status: AC
  Filled 2017-10-24: qty 30

## 2017-10-24 MED ORDER — LACTATED RINGERS IV SOLN
INTRAVENOUS | Status: DC
  Administered 2017-10-24: 1000 mL via INTRAVENOUS
  Administered 2017-10-25: 02:00:00 via INTRAVENOUS

## 2017-10-24 MED ORDER — MISOPROSTOL 200 MCG PO TABS
ORAL_TABLET | ORAL | Status: AC
  Administered 2017-10-24: 25 ug via VAGINAL
  Filled 2017-10-24: qty 4

## 2017-10-24 MED ORDER — ONDANSETRON HCL 4 MG/2ML IJ SOLN: 4.0000 mg | Freq: Four times a day (QID) | INTRAMUSCULAR | Status: DC | PRN

## 2017-10-24 MED ORDER — OXYTOCIN 40 UNITS IN LACTATED RINGERS INFUSION - SIMPLE MED
2.5000 [IU]/h | INTRAVENOUS | Status: DC
  Filled 2017-10-24: qty 1000

## 2017-10-24 MED ORDER — TERBUTALINE SULFATE 1 MG/ML IJ SOLN: 0.2500 mg | Freq: Once | INTRAMUSCULAR | Status: DC | PRN

## 2017-10-24 MED ORDER — AMMONIA AROMATIC IN INHA
RESPIRATORY_TRACT | Status: AC
  Filled 2017-10-24: qty 10

## 2017-10-24 MED ORDER — ACETAMINOPHEN 325 MG PO TABS: 650.0000 mg | ORAL_TABLET | ORAL | Status: DC | PRN

## 2017-10-24 MED ORDER — SOD CITRATE-CITRIC ACID 500-334 MG/5ML PO SOLN: 30.0000 mL | ORAL | Status: DC | PRN

## 2017-10-24 MED ORDER — OXYTOCIN 10 UNIT/ML IJ SOLN
INTRAMUSCULAR | Status: AC
  Filled 2017-10-24: qty 2

## 2017-10-24 MED ORDER — BUTORPHANOL TARTRATE 2 MG/ML IJ SOLN
1.0000 mg | INTRAMUSCULAR | Status: DC | PRN
  Administered 2017-10-24 – 2017-10-25 (×7): 1 mg via INTRAVENOUS
  Filled 2017-10-24 (×7): qty 1

## 2017-10-24 MED ORDER — LIDOCAINE HCL (PF) 1 % IJ SOLN: 30.0000 mL | INTRAMUSCULAR | Status: DC | PRN

## 2017-10-24 MED ORDER — OXYTOCIN BOLUS FROM INFUSION
500.0000 mL | Freq: Once | INTRAVENOUS | Status: AC
  Administered 2017-10-25: 500 mL via INTRAVENOUS

## 2017-10-24 MED ORDER — MISOPROSTOL 25 MCG QUARTER TABLET
25.0000 ug | ORAL_TABLET | ORAL | Status: DC | PRN
  Administered 2017-10-24 (×2): 25 ug via VAGINAL
  Filled 2017-10-24 (×2): qty 1

## 2017-10-24 NOTE — H&P (Signed)
Regina Baker is a 22 y.o. female presenting for IOL due to [facility] and rising diastolics.  OB History    Gravida  1   Para      Term      Preterm      AB      Living  0     SAB      TAB      Ectopic      Multiple      Live Births             Past Medical History:  Diagnosis Date  . Constipation    Past Surgical History:  Procedure Laterality Date  . KNEE SURGERY Right    Family History: family history is not on file. Social History:  reports that she has never smoked. She has never used smokeless tobacco. She reports that she does not drink alcohol or use drugs.     Maternal Diabetes: Genetic Screening:  Maternal Ultrasounds/Referrals:  Fetal Ultrasounds or other Referrals:   Maternal Substance Abuse:   Significant Maternal Medications:   Significant Maternal Lab Results:   Other Comments:   Review of Systems  Constitutional: Negative.   HENT: Negative.   Eyes: Negative.   Respiratory: Negative.   Cardiovascular: Negative.   Genitourinary: Negative.   Musculoskeletal: Positive for back pain.  Skin: Negative.   Neurological: Negative.   Endo/Heme/Allergies: Negative.   Psychiatric/Behavioral: Negative.    History Dilation: 1 Effacement (%): 60 Station: -2 Exam by:: Nicanor Bake RN Blood pressure 117/65, pulse 99, temperature (!) 97.5 F (36.4 C), temperature source Oral, resp. rate 18, height 5\' 7"  (1.702 m), weight 99.3 kg. Exam Physical Exam  Prenatal labs: ABO, Rh: --/--/B POS (09/02 0232) Antibody: NEG (09/02 0232) Rubella:  Immune RPR:   NR HBsAg:   Neg HIV:   Neg GBS:   Neg  Assessment/Plan: A:1. CHTN IOl 2. GBS neg P: Continue orders for IOL. 1. Antic delivery.   [name] 10/24/2017, 9:32 AM

## 2017-10-24 NOTE — Progress Notes (Signed)
Regina Baker is a 22 y.o. G1P0 at [redacted]w[redacted]d  admitted for IOL for CHTN at 37 weeks.   Subjective: "I feel some discomfort in my back"  Objective: BP 117/65 (BP Location: Left Arm)   Pulse 99   Temp (!) 97.5 F (36.4 C) (Oral)   Resp 18   Ht 5\' 7"  (1.702 m)   Wt 99.3 kg   BMI 34.30 kg/m  No intake/output data recorded. No intake/output data recorded.  FHT:  140 UC:   irregular SVE:   Dilation: 1 Effacement (%): 60 Station: -2 Exam by:: Nicanor Bake RN  Labs: Lab Results  Component Value Date   WBC 13.2 (H) 10/24/2017   HGB 11.9 (L) 10/24/2017   HCT 34.4 (L) 10/24/2017   MCV 83.4 10/24/2017   PLT 233 10/24/2017    Assessment / Plan: A:1. IUP at 38 5/7 weeks 2. CHTN for IOL 3. GBS neg P:1. P:C ratio is 170 2. Cytotec per vagina placed at 0800 and will recheck pt at 1200. 3.Continue to monitor uC/FHT's and VS. __________________________________ Myrtie Cruise, MSN, CNM, FNP Certified Nurse Midwife [facility] OB/GYN [facility] [name] 10/24/2017, 9:14 AM

## 2017-10-24 NOTE — Progress Notes (Addendum)
Regina Baker is a 22 y.o. G1P0 at 109w6d for [facility] IOL admitted for IOL based on increasing diastolics.   Subjective: "I do not feel anything but, back pain"  Objective: BP (!) 116/58   Pulse 97   Temp 97.9 F (36.6 C) (Oral)   Resp 18   Ht 5\' 7"  (1.702 m)   Wt 99.3 kg   BMI 34.30 kg/m  No intake/output data recorded. No intake/output data recorded.  FHT: 140, +accels, no decels, Cat 1 UC:   None on monitor  SVE:   Dilation: 1 Effacement (%): 60 Station: -2 Exam by:: Nicanor Bake RN  Labs: Lab Results  Component Value Date   WBC 13.2 (H) 10/24/2017   HGB 11.9 (L) 10/24/2017   HCT 34.4 (L) 10/24/2017   MCV 83.4 10/24/2017   PLT 233 10/24/2017   PROCEDURE: IN lithotomy pos, pt consented for [facility] catheter and betadine prep to cx. Cx unchanged: 1/60%/vtx-2, cook cath inserted x 2 balloons x 80 ml'seach and taped to Lt thigh. Pt tol  Procedure well.  Assessment / Plan: A:1. CHTN for IOL with Foley bulb and Pitocin 2. Continue to monitor FHT/UC P: Start Pitocin per protocol. 2. Continue to monitor UC/FHT's     [name] 10/24/2017, 1:16 PM

## 2017-10-24 NOTE — Progress Notes (Signed)
Regina Baker is a 22 y.o. G1P0 at [redacted]w[redacted]d admitted for IOL for CHTN on Pitocin and Cook catheter.   Subjective: "My back is hurting"  Objective: BP 134/86   Pulse 92   Temp (!) 97.5 F (36.4 C) (Oral)   Resp 18   Ht 5\' 7"  (1.702 m)   Wt 99.3 kg   BMI 34.30 kg/m  No intake/output data recorded. Total I/O In: 797.8 [I.V.:797.8] Out: -   FHT: 150, 1 variable to 140  UC:  Uterine irritability SVE:   Dilation: 1 Effacement (%): 60 Station: -2 Exam by:: Regina Baker, CNM  Labs: Lab Results  Component Value Date   WBC 13.2 (H) 10/24/2017   HGB 11.9 (L) 10/24/2017   HCT 34.4 (L) 10/24/2017   MCV 83.4 10/24/2017   PLT 233 10/24/2017    Assessment / Plan: A:1. IUP at 38 6/7 weeks P:`. Pt just got Stadol for discomfort. 2. Continue Pitocin per protocol currently at 12 mun/min 3. Continue to monitor UC/FHT's   [name] 10/24/2017, 6:00 PM

## 2017-10-24 NOTE — Progress Notes (Signed)
Regina Baker is a 22 y.o. G1P0 at [redacted]w[redacted]d admitted for [facility] for IOL.  Subjective: "I would like a sleeping pill tonight"  Objective: BP 129/80   Pulse 98   Temp 98.8 F (37.1 C) (Oral)   Resp 20   Ht 5\' 7"  (1.702 m)   Wt 99.3 kg   BMI 34.30 kg/m  I/O last 3 completed shifts: In: 1073.6 [I.V.:1073.6] Out: -  Total I/O In: 185.3 [I.V.:185.3] Out: -   FHT: 150, +accels, mod variabiltiy UC:   Mild to palp and not in a pattern SVE:   Dilation: 1 Effacement (%): 60 Station: -2 Exam by:: Beatriz Stallion, CNM  Labs: Lab Results  Component Value Date   WBC 13.2 (H) 10/24/2017   HGB 11.9 (L) 10/24/2017   HCT 34.4 (L) 10/24/2017   MCV 83.4 10/24/2017   PLT 233 10/24/2017    Assessment / Plan: A:1. IUP at 38 6/7 weeks 2. GBs neg 3. CHTN for IOL P: Continue Cook Catheter, pulled on it but, would not pull out  2. Continue Pitocin at 18 mun/min 3. Continue to monitor UC/FHT [name] 10/24/2017, 9:37 PM

## 2017-10-25 ENCOUNTER — Inpatient Hospital Stay: Payer: Medicaid Other | Admitting: Anesthesiology

## 2017-10-25 MED ORDER — WITCH HAZEL-GLYCERIN EX PADS: 1.0000 "application " | MEDICATED_PAD | CUTANEOUS | Status: DC | PRN

## 2017-10-25 MED ORDER — FENTANYL 2.5 MCG/ML W/ROPIVACAINE 0.15% IN NS 100 ML EPIDURAL (ARMC): 12.0000 mL/h | EPIDURAL | Status: DC

## 2017-10-25 MED ORDER — IBUPROFEN 600 MG PO TABS
600.0000 mg | ORAL_TABLET | Freq: Four times a day (QID) | ORAL | Status: DC
  Administered 2017-10-25 – 2017-10-27 (×8): 600 mg via ORAL
  Filled 2017-10-25 (×8): qty 1

## 2017-10-25 MED ORDER — DIPHENHYDRAMINE HCL 50 MG/ML IJ SOLN: 12.5000 mg | INTRAMUSCULAR | Status: DC | PRN

## 2017-10-25 MED ORDER — DIBUCAINE 1 % RE OINT: 1.0000 "application " | TOPICAL_OINTMENT | RECTAL | Status: DC | PRN

## 2017-10-25 MED ORDER — EPHEDRINE 5 MG/ML INJ: 10.0000 mg | INTRAVENOUS | Status: DC | PRN

## 2017-10-25 MED ORDER — LACTATED RINGERS IV SOLN: 500.0000 mL | Freq: Once | INTRAVENOUS | Status: DC

## 2017-10-25 MED ORDER — TETANUS-DIPHTH-ACELL PERTUSSIS 5-2.5-18.5 LF-MCG/0.5 IM SUSP
0.5000 mL | Freq: Once | INTRAMUSCULAR | Status: DC
  Filled 2017-10-25: qty 0.5

## 2017-10-25 MED ORDER — VARICELLA VIRUS VACCINE LIVE 1350 PFU/0.5ML IJ SUSR
0.5000 mL | Freq: Once | INTRAMUSCULAR | Status: AC
  Filled 2017-10-25: qty 0.5

## 2017-10-25 MED ORDER — ONDANSETRON HCL 4 MG/2ML IJ SOLN: 4.0000 mg | INTRAMUSCULAR | Status: DC | PRN

## 2017-10-25 MED ORDER — OXYCODONE HCL 5 MG PO TABS: 10.0000 mg | ORAL_TABLET | ORAL | Status: DC | PRN

## 2017-10-25 MED ORDER — PRENATAL MULTIVITAMIN CH
1.0000 | ORAL_TABLET | Freq: Every day | ORAL | Status: DC
  Administered 2017-10-26 – 2017-10-27 (×2): 1 via ORAL
  Filled 2017-10-25 (×2): qty 1

## 2017-10-25 MED ORDER — SODIUM CHLORIDE 0.9 % IV SOLN
INTRAVENOUS | Status: DC | PRN
  Administered 2017-10-25 (×2): 5 mL via EPIDURAL

## 2017-10-25 MED ORDER — DIPHENHYDRAMINE HCL 25 MG PO CAPS: 25.0000 mg | ORAL_CAPSULE | Freq: Four times a day (QID) | ORAL | Status: DC | PRN

## 2017-10-25 MED ORDER — SENNOSIDES-DOCUSATE SODIUM 8.6-50 MG PO TABS
2.0000 | ORAL_TABLET | ORAL | Status: DC
  Administered 2017-10-26 – 2017-10-27 (×2): 2 via ORAL
  Filled 2017-10-25 (×3): qty 2

## 2017-10-25 MED ORDER — COCONUT OIL OIL
1.0000 "application " | TOPICAL_OIL | Status: DC | PRN
  Administered 2017-10-27: 1 via TOPICAL
  Filled 2017-10-25: qty 120

## 2017-10-25 MED ORDER — ONDANSETRON HCL 4 MG PO TABS: 4.0000 mg | ORAL_TABLET | ORAL | Status: DC | PRN

## 2017-10-25 MED ORDER — LIDOCAINE HCL (PF) 1 % IJ SOLN
INTRAMUSCULAR | Status: DC | PRN
  Administered 2017-10-25: 2 mL

## 2017-10-25 MED ORDER — BENZOCAINE-MENTHOL 20-0.5 % EX AERO
1.0000 "application " | INHALATION_SPRAY | CUTANEOUS | Status: DC | PRN
  Administered 2017-10-26 – 2017-10-27 (×2): 1 via TOPICAL
  Filled 2017-10-25 (×2): qty 56

## 2017-10-25 MED ORDER — SIMETHICONE 80 MG PO CHEW: 80.0000 mg | CHEWABLE_TABLET | ORAL | Status: DC | PRN

## 2017-10-25 MED ORDER — OXYCODONE HCL 5 MG PO TABS
5.0000 mg | ORAL_TABLET | ORAL | Status: DC | PRN
  Administered 2017-10-25: 5 mg via ORAL
  Filled 2017-10-25: qty 1

## 2017-10-25 MED ORDER — PHENYLEPHRINE 40 MCG/ML (10ML) SYRINGE FOR IV PUSH (FOR BLOOD PRESSURE SUPPORT): 80.0000 ug | PREFILLED_SYRINGE | INTRAVENOUS | Status: DC | PRN

## 2017-10-25 MED ORDER — LIDOCAINE-EPINEPHRINE (PF) 1.5 %-1:200000 IJ SOLN
INTRAMUSCULAR | Status: DC | PRN
  Administered 2017-10-25: 3 mL via EPIDURAL

## 2017-10-25 MED ORDER — FENTANYL 2.5 MCG/ML W/ROPIVACAINE 0.15% IN NS 100 ML EPIDURAL (ARMC)
EPIDURAL | Status: DC | PRN
  Administered 2017-10-25: 12 mL/h via EPIDURAL

## 2017-10-25 MED ORDER — ZOLPIDEM TARTRATE 5 MG PO TABS: 5.0000 mg | ORAL_TABLET | Freq: Every evening | ORAL | Status: DC | PRN

## 2017-10-25 MED ORDER — FENTANYL 2.5 MCG/ML W/ROPIVACAINE 0.15% IN NS 100 ML EPIDURAL (ARMC)
EPIDURAL | Status: AC
  Filled 2017-10-25: qty 100

## 2017-10-25 MED ORDER — ACETAMINOPHEN 325 MG PO TABS
650.0000 mg | ORAL_TABLET | ORAL | Status: DC | PRN
  Administered 2017-10-26: 650 mg via ORAL
  Filled 2017-10-25: qty 2

## 2017-10-25 NOTE — Progress Notes (Signed)
Labor Progress Note  Regina Baker is a 22 y.o. G1P0 at [redacted]w[redacted]d, admitted for IOL due to [facility].   Subjective: comfortable s/p epidural placement, feeling slight rectal pressure with UCs.   Objective: BP 110/74   Pulse 90   Temp 99.1 F (37.3 C) (Oral)   Resp 20   Ht 5\' 7"  (1.702 m)   Wt 99.3 kg   BMI 34.30 kg/m  Notable VS details: reviewed, normotensive.   Fetal Assessment: FHT:  FHR: 140 bpm, variability: marked,  accelerations:  Present,  decelerations:  Absent Category/reactivity:  Category I UC:   regular, every 1.5-3 minutes, IUPC in place. PItocin at 73mu/min.  SVE:   5/80/0, soft/anterior.  Membrane status: AROM at 0105 Amniotic color: clear  Labs: Lab Results  Component Value Date   WBC 13.2 (H) 10/24/2017   HGB 11.9 (L) 10/24/2017   HCT 34.4 (L) 10/24/2017   MCV 83.4 10/24/2017   PLT 233 10/24/2017    Assessment / Plan: IOL at 39+0wks- CHTN Early labor  Labor: early labor, s/p cytotec and COok cath, Pitocin and AROM overnight. has had multiple IUPC- fundus palp soft, but baseline remains at .   Preeclampsia:  no e/o Pre-e; normal admit labs. normotensive.  Fetal Wellbeing:  Category I Pain Control:  Epidural I/D:  n/a Anticipated MOD:  NSVD  Dylann Layne A, CNM 10/25/2017, 10:18 AM

## 2017-10-25 NOTE — Progress Notes (Signed)
Regina Baker is a 22 y.o. G1P0 at [redacted]w[redacted]d for [facility]  admitted for IOL at 39 weeks. Pt has received Cytotec x 2 doses, Cook catheter and Pitocin at 24 mun/min.   Subjective: "I am feeling back pain"  Objective: BP (!) 105/51   Pulse (!) 108   Temp 98.3 F (36.8 C) (Oral)   Resp 16   Ht 5\' 7"  (1.702 m)   Wt 99.3 kg   BMI 34.30 kg/m  I/O last 3 completed shifts: In: 1073.6 [I.V.:1073.6] Out: -  Total I/O In: 682.2 [I.V.:682.2] Out: -   FHT: 140, +accels, 1 variable to 140 x 10 secs,  UC:  q 1-1/2 mins,  SVE:   Dilation: 4 Effacement (%): 50, 60 Station: -2 Exam by:: A. Ore RN  Labs: Lab Results  Component Value Date   WBC 13.2 (H) 10/24/2017   HGB 11.9 (L) 10/24/2017   HCT 34.4 (L) 10/24/2017   MCV 83.4 10/24/2017   PLT 233 10/24/2017    Assessment / Plan: A:1. CHTN at 39 weeks for IOL 2. Obesity P:Pitocin cut off due to hyperstimulation showing in the IUPC and the ext and UC's can hadley be palpated. Will give pt a break and restart Pitocin as soon as uterine irritability spaces out. Pt was AROM'd at 0110 with bloody clear fluid noted. IUPC inserted x 2 but, first one clotted off and the 2nd one did not work showing a 50 mm resting tone with no palpable firmness of uterus. Pt only having back pain at intervals and not all the time. Fluid bolus given. IUPC re-zeroed multiple times but, resting tone always reading 45-50 and no palp firmness so removed. Back on ext monitor with similar pattern so Pitocin turned off. Will rest pt and restart Pitocin.   Regina Baker 10/25/2017, 1:33 AM

## 2017-10-25 NOTE — Progress Notes (Signed)
Regina Baker is a 22 y.o. G1P0 at [redacted]w[redacted]d  admitted for [facility] on no meds.   Subjective: My back is "killing me"  Objective: BP 137/84 (BP Location: Left Arm)   Pulse (!) 117   Temp 98.3 F (36.8 C) (Oral)   Resp 20   Ht 5\' 7"  (1.702 m)   Wt 99.3 kg   BMI 34.30 kg/m  I/O last 3 completed shifts: In: 1073.6 [I.V.:1073.6] Out: -  Total I/O In: 1611.9 [I.V.:1611.9] Out: -   FHT: 150, +accels, no decels CAt 1 UC:  q 2 mins, IUPC reinserted and improved tracing however the resting tone is 40 mm but, pt is soft in between UC's and does not feel any contraction in between SVE:   5/70%/vtx-1 soft Pelvis:difficult to asess due to pt's discomfort, there are no encroaching ischial spines palp, suprapubic arch is normal and unable to reach the sacral promnitory due to pt discomfort. With fetal position, poss OP Labs: Lab Results  Component Value Date   WBC 13.2 (H) 10/24/2017   HGB 11.9 (L) 10/24/2017   HCT 34.4 (L) 10/24/2017   MCV 83.4 10/24/2017   PLT 233 10/24/2017    Assessment / Plan: A:1. IUP at 39 weeks with CHTN 2. IOL for CHTN 3. IUPC/IFSE for improved monitoring P:1. Will order epidural now 2. Continue Pitocin per protocol 3. Monitor VS/FHT and UC's ___________________________ Myrtie Cruise, MSN, CNM, FNP Certified Nurse Midwife [facility] OB/GYN [facility]   [name] 10/25/2017, 6:53 AM

## 2017-10-25 NOTE — Anesthesia Procedure Notes (Signed)
Epidural Patient location during procedure: OB Start time: 10/25/2017 8:02 AM End time: 10/25/2017 8:08 AM  Staffing Anesthesiologist: Lenard Simmer, MD Performed: anesthesiologist   Preanesthetic Checklist Completed: patient identified, site marked, surgical consent, pre-op evaluation, timeout performed, IV checked, risks and benefits discussed and monitors and equipment checked  Epidural Patient position: sitting Prep: ChloraPrep Patient monitoring: heart rate, continuous pulse ox and blood pressure Approach: midline Location: L3-L4 Injection technique: LOR saline  Needle:  Needle type: Tuohy  Needle gauge: 17 G Needle length: 9 cm and 9 Needle insertion depth: 6 cm Catheter type: closed end flexible Catheter size: 19 Gauge Catheter at skin depth: 11 cm Test dose: negative and 1.5% lidocaine with Epi 1:200 K  Assessment Sensory level: T10 Events: blood not aspirated, injection not painful, no injection resistance, negative IV test and no paresthesia  Additional Notes 1st attempt Pt. Evaluated and documentation done after procedure finished. Patient identified. Risks/Benefits/Options discussed with patient including but not limited to bleeding, infection, nerve damage, paralysis, failed block, incomplete pain control, headache, blood pressure changes, nausea, vomiting, reactions to medication both or allergic, itching and postpartum back pain. Confirmed with bedside nurse the patient's most recent platelet count. Confirmed with patient that they are not currently taking any anticoagulation, have any bleeding history or any family history of bleeding disorders. Patient expressed understanding and wished to proceed. All questions were answered. Sterile technique was used throughout the entire procedure. Please see nursing notes for vital signs. Test dose was given through epidural catheter and negative prior to continuing to dose epidural or start infusion. Warning signs of high  block given to the patient including shortness of breath, tingling/numbness in hands, complete motor block, or any concerning symptoms with instructions to call for help. Patient was given instructions on fall risk and not to get out of bed. All questions and concerns addressed with instructions to call with any issues or inadequate analgesia.   Patient tolerated the insertion well without immediate complications.Reason for block:procedure for pain

## 2017-10-25 NOTE — Anesthesia Preprocedure Evaluation (Signed)
Anesthesia Evaluation  Patient identified by MRN, date of birth, ID band Patient awake    Reviewed: Allergy & Precautions, H&P , NPO status , Patient's Chart, lab work & pertinent test results, reviewed documented beta blocker date and time   History of Anesthesia Complications Negative for: history of anesthetic complications  Airway Mallampati: III  TM Distance: >3 FB Neck ROM: full    Dental  (+) Teeth Intact   Pulmonary neg pulmonary ROS,           Cardiovascular Exercise Tolerance: Good hypertension, (-) angina(-) dysrhythmias (-) Valvular Problems/Murmurs     Neuro/Psych negative neurological ROS  negative psych ROS   GI/Hepatic Neg liver ROS, GERD  ,  Endo/Other  negative endocrine ROS  Renal/GU negative Renal ROS  negative genitourinary   Musculoskeletal   Abdominal   Peds  Hematology negative hematology ROS (+)   Anesthesia Other Findings Past Medical History: No date: Constipation   Reproductive/Obstetrics (+) Pregnancy                             Anesthesia Physical Anesthesia Plan  ASA: II  Anesthesia Plan: Epidural   Post-op Pain Management:    Induction:   PONV Risk Score and Plan:   Airway Management Planned:   Additional Equipment:   Intra-op Plan:   Post-operative Plan:   Informed Consent: I have reviewed the patients History and Physical, chart, labs and discussed the procedure including the risks, benefits and alternatives for the proposed anesthesia with the patient or authorized representative who has indicated his/her understanding and acceptance.   Dental Advisory Given  Plan Discussed with: Anesthesiologist, CRNA and Surgeon  Anesthesia Plan Comments:         Anesthesia Quick Evaluation

## 2017-10-25 NOTE — Discharge Summary (Signed)
Obstetrical Discharge Summary  Patient Name: Regina Baker DOB: Mar 02, 1995 MRN: [id number]  Date of Admission: 10/24/2017 Date of Delivery: 10/25/17 Delivered by: Bonnell Public CNM Date of Discharge:10/27/17 Primary OB: [facility]  LMP:No LMP recorded. EDC Estimated Date of Delivery: 11/01/17 Gestational Age at Delivery: [redacted]w[redacted]d   Antepartum complications:  1. CHTN, no meds 2. Obesity 3. Increased risk of trisomy 78, risk 1/103 by first trimester screen.  Admitting Diagnosis: CHTN complicating pregnancy Secondary Diagnosis: SVD, 1st deg perineal lac  Patient Active Problem List   Diagnosis Date Noted  . Labor and delivery indication for care or intervention 10/24/2017  . Pregnancy 10/04/2017  . Abdominal pain in pregnancy, third trimester 08/19/2017  . First trimester screening     Augmentation: AROM, Pitocin, Cytotec and Foley Balloon Complications: None Intrapartum complications/course: IOL with cytotec, Cook cath, Pitocin, AROM. Rapid progression after epidural, pushed well to SVD of viable female. cytotec PR given for PPH prophy after delivery of placenta.  Date of Delivery: 10/25/17 Delivered By: Bonnell Public CNM Delivery Type: spontaneous vaginal delivery Anesthesia: epidural Placenta: spontaneous Laceration: 1st deg perineal and right periurethral Episiotomy: none Newborn Data: Live born female  Birth Weight:   APGAR: 7, 9  Newborn Delivery   Birth date/time:  10/25/2017 12:19:00 Delivery type:  Vaginal, Spontaneous       Postpartum Procedures: none  Post partum course: uncomplicated Patient had an uncomplicated postpartum course.  By time of discharge on PPD#2, her pain was controlled on oral pain medications; she had appropriate lochia and was ambulating, voiding without difficulty and tolerating regular diet.  She was deemed stable for discharge to home.     Discharge Physical Exam: 10/27/2017  BP 109/71 (BP Location: Right Arm)   Pulse 73   Temp 98.1 F  (36.7 C) (Oral)   Resp 18   Ht 5\' 7"  (1.702 m)   Wt 99.3 kg   SpO2 97%   Breastfeeding? Unknown   BMI 34.30 kg/m   General: NAD CV: RRR Pulm: CTABL, nl effort ABD: s/nd/nt, fundus firm and below the umbilicus Lochia: moderate  DVT Evaluation: LE non-ttp, no evidence of DVT on exam.  Hemoglobin  Date Value Ref Range Status  10/27/2017 10.4 (L) 12.0 - 16.0 g/dL Final   HCT  Date Value Ref Range Status  10/27/2017 29.6 (L) 35.0 - 47.0 % Final     Disposition: stable, discharge to home. Baby Feeding: breastmilk Baby Disposition: home with mom  Rh Immune globulin given: n/a Rubella vaccine given: immune Varicella vaccine given: Tdap vaccine given in AP setting: 08/31/17 Flu vaccine given in AP or PP setting: n/a  Contraception: micronor   Prenatal Labs:  Blood type/Rh  B Pos  Antibody screen neg  Rubella Immune  Varicella  Non-Immune  RPR NR  HBsAg Neg  HIV NR  GC neg  Chlamydia neg  Genetic screening  Increased risk of trisomy 53, risk 1/103 by first trimester screen.  1 hour GTT  106  GBS  Neg     Plan:  Regina Baker was discharged to home in good condition. Follow-up appointment with delivering provider in 6 weeks.  Discharge Medications: Allergies as of 10/27/2017      Reactions   Penicillins Rash      Medication List    STOP taking these medications   aspirin 81 MG chewable tablet     TAKE these medications   benzocaine-Menthol 20-0.5 % Aero Commonly known as:  DERMOPLAST Apply 1 application topically as  needed for irritation (perineal discomfort).   ibuprofen 600 MG tablet Commonly known as:  ADVIL,MOTRIN Take 1 tablet (600 mg total) by mouth every 6 (six) hours.   norethindrone 0.35 MG tablet Commonly known as:  MICRONOR,CAMILA,ERRIN Take 1 tablet (0.35 mg total) by mouth daily.   prenatal multivitamin Tabs tablet Take 1 tablet by mouth daily at 12 noon.   varicella virus vaccine live 1350 PFU/0.5ML Inj injection Commonly  known as:  VARIVAX Inject 0.5 mLs into the skin once for 1 dose.       Follow-up Information    McVey, Prudencio Pair, CNM. Schedule an appointment as soon as possible for a visit in 6 week(s).   Specialty:  Obstetrics and Gynecology Why:  Please call to schedule your 6 week postpartum follow up appointment with Heloise Ochoa  Contact information: [address] [place] [address] [phone]           Signed:  Suzy Bouchard, MD 10/27/17 11:45 AM

## 2017-10-26 LAB — CBC
HEMATOCRIT: 31.5 % — AB (ref 35.0–47.0)
Hemoglobin: 11 g/dL — ABNORMAL LOW (ref 12.0–16.0)
MCH: 29.2 pg (ref 26.0–34.0)
MCHC: 34.8 g/dL (ref 32.0–36.0)
MCV: 83.8 fL (ref 80.0–100.0)
Platelets: 219 10*3/uL (ref 150–440)
RBC: 3.76 MIL/uL — AB (ref 3.80–5.20)
RDW: 14.6 % — ABNORMAL HIGH (ref 11.5–14.5)
WBC: 14.8 10*3/uL — ABNORMAL HIGH (ref 3.6–11.0)

## 2017-10-26 LAB — SYPHILIS: RPR W/REFLEX TO RPR TITER AND TREPONEMAL ANTIBODIES, TRADITIONAL SCREENING AND DIAGNOSIS ALGORITHM: RPR Ser Ql: NONREACTIVE

## 2017-10-26 NOTE — Plan of Care (Signed)
Vs stable; up ad lib; tolerating regular diet; taking motrin for pain control; pt pumps and gives the baby pumped colostrum and formula

## 2017-10-26 NOTE — Lactation Note (Signed)
This note was copied from a baby's chart. Lactation Consultation Note  Patient Name: Regina Baker NOBSJ'G Date: 10/26/2017     During [facility] rounds, Mom said she was doing "well" with hand expression and pumping. She denied having other needs at this point. She has LC contact info and support group info   Maternal Data    Feeding    LATCH Score                   Interventions    Lactation Tools Discussed/Used     Consult Status      Sunday Corn 10/26/2017, 4:33 PM

## 2017-10-26 NOTE — Discharge Instructions (Signed)

## 2017-10-26 NOTE — Anesthesia Postprocedure Evaluation (Signed)
Anesthesia Post Note  Patient: Regina Baker  Procedure(s) Performed: AN AD HOC LABOR EPIDURAL  Patient location during evaluation: Mother Baby Anesthesia Type: Epidural Level of consciousness: awake, awake and alert and oriented Pain management: pain level controlled Vital Signs Assessment: post-procedure vital signs reviewed and stable Respiratory status: spontaneous breathing and nonlabored ventilation Cardiovascular status: blood pressure returned to baseline and stable Postop Assessment: no headache Anesthetic complications: no     Last Vitals:  Vitals:   10/26/17 0348 10/26/17 0740  BP: 116/79 125/78  Pulse: 99 97  Resp: 18 18  Temp: 36.6 C 36.8 C  SpO2: 97% 98%    Last Pain:  Vitals:   10/26/17 0740  TempSrc: Oral  PainSc:                  Vernie Murders

## 2017-10-26 NOTE — Progress Notes (Signed)
Post Partum Day 1  Subjective: Doing well, no concerns. Ambulating without difficulty, pain managed with PO meds, tolerating regular diet, and voiding without difficulty.   No fever/chills, chest pain, shortness of breath, nausea/vomiting, or leg pain. No nipple or breast pain.   Objective: BP 125/78 (BP Location: Right Arm)   Pulse 97   Temp 98.2 F (36.8 C) (Oral)   Resp 18   Ht 5\' 7"  (1.702 m)   Wt 99.3 kg   SpO2 98%   Breastfeeding? Unknown   BMI 34.30 kg/m    Physical Exam:  General: alert, cooperative, appears stated age and no distress Breasts: soft/nontender CV: RRR Pulm: nl effort, CTABL Abdomen: soft, non-tender, active bowel sounds Uterine Fundus: firm Lochia: appropriate DVT Evaluation: No evidence of DVT seen on physical exam. No cords or calf tenderness. No significant calf/ankle edema.  Recent Labs    10/24/17 0233 10/26/17 0543  HGB 11.9* 11.0*  HCT 34.4* 31.5*  WBC 13.2* 14.8*  PLT 233 219    Assessment/Plan: 22 y.o. G1P1001 postpartum day # 1  -Continue routine PP care -Lactation consult PRN for pumping support  -Plans OCPs for contraception  -Immunization status: Needs varicella prior to discharge  Disposition: Continue inpatient postpartum care   LOS: 2 days   Genia Del, CNM 10/26/2017, 11:39 AM   ----- Genia Del Certified Nurse Midwife [facility]

## 2017-10-27 LAB — CBC
HEMATOCRIT: 29.6 % — AB (ref 35.0–47.0)
Hemoglobin: 10.4 g/dL — ABNORMAL LOW (ref 12.0–16.0)
MCH: 29.6 pg (ref 26.0–34.0)
MCHC: 35.2 g/dL (ref 32.0–36.0)
MCV: 84 fL (ref 80.0–100.0)
Platelets: 215 10*3/uL (ref 150–440)
RBC: 3.52 MIL/uL — ABNORMAL LOW (ref 3.80–5.20)
RDW: 14.4 % (ref 11.5–14.5)
WBC: 11.4 10*3/uL — ABNORMAL HIGH (ref 3.6–11.0)

## 2017-10-27 MED ORDER — IBUPROFEN 600 MG PO TABS: 600.0000 mg | ORAL_TABLET | Freq: Four times a day (QID) | ORAL | 0 refills | Status: DC

## 2017-10-27 MED ORDER — VARICELLA VIRUS VACCINE LIVE 1350 PFU/0.5ML IJ SUSR
0.5000 mL | Freq: Once | INTRAMUSCULAR | Status: AC
  Administered 2017-10-27: 0.5 mL via SUBCUTANEOUS
  Filled 2017-10-27: qty 0.5

## 2017-10-27 MED ORDER — VARICELLA VIRUS VACCINE LIVE 1350 PFU/0.5ML IJ SUSR: 0.5000 mL | Freq: Once | INTRAMUSCULAR | 0 refills | Status: AC

## 2017-10-27 MED ORDER — FERROUS SULFATE 325 (65 FE) MG PO TABS
325.0000 mg | ORAL_TABLET | Freq: Every day | ORAL | Status: DC
  Administered 2017-10-27: 325 mg via ORAL
  Filled 2017-10-27: qty 1

## 2017-10-27 MED ORDER — NORETHINDRONE 0.35 MG PO TABS: 1.0000 | ORAL_TABLET | Freq: Every day | ORAL | 11 refills | Status: DC

## 2017-10-27 MED ORDER — BENZOCAINE-MENTHOL 20-0.5 % EX AERO: 1.0000 "application " | INHALATION_SPRAY | CUTANEOUS | 1 refills | Status: DC | PRN

## 2017-10-27 NOTE — Plan of Care (Signed)
Patient's vital signs stable; fundus firm; small amount rubra lochia; tolerating regular diet well; voiding; good maternal-infant bonding observed; mom pumping breast and feeding infant breastmilk and formula via bottle.

## 2017-10-27 NOTE — Progress Notes (Signed)
Discharge order received from doctor. Varicella given prior to discharge. Reviewed discharge instructions and prescriptions with patient and answered all questions. Follow up appointment instructions given. Patient verbalized understanding. ID bands checked. Patient discharged home with infant via wheelchair by nursing/auxillary.    Oswald Hillock, RN

## 2019-02-07 ENCOUNTER — Other Ambulatory Visit: Payer: Self-pay

## 2019-07-17 ENCOUNTER — Other Ambulatory Visit: Payer: Self-pay

## 2019-07-17 ENCOUNTER — Encounter: Payer: Self-pay | Admitting: Emergency Medicine

## 2019-07-17 ENCOUNTER — Emergency Department: Payer: Medicaid Other

## 2019-07-17 ENCOUNTER — Emergency Department
Admission: EM | Admit: 2019-07-17 | Discharge: 2019-07-17 | Disposition: A | Discharge status: Home / Self Care | Payer: Medicaid Other | Attending: Emergency Medicine | Admitting: Emergency Medicine

## 2019-07-17 DIAGNOSIS — S8991XA Unspecified injury of right lower leg, initial encounter: Secondary | ICD-10-CM | POA: Insufficient documentation

## 2019-07-17 DIAGNOSIS — Y999 Unspecified external cause status: Secondary | ICD-10-CM | POA: Insufficient documentation

## 2019-07-17 DIAGNOSIS — Z79899 Other long term (current) drug therapy: Secondary | ICD-10-CM | POA: Diagnosis not present

## 2019-07-17 DIAGNOSIS — Y9389 Activity, other specified: Secondary | ICD-10-CM | POA: Diagnosis not present

## 2019-07-17 DIAGNOSIS — X509XXA Other and unspecified overexertion or strenuous movements or postures, initial encounter: Secondary | ICD-10-CM | POA: Insufficient documentation

## 2019-07-17 DIAGNOSIS — Y9289 Other specified places as the place of occurrence of the external cause: Secondary | ICD-10-CM | POA: Diagnosis not present

## 2019-07-17 MED ORDER — IBUPROFEN 600 MG PO TABS: 600.0000 mg | ORAL_TABLET | Freq: Four times a day (QID) | ORAL | 0 refills | Status: DC | PRN

## 2019-07-17 NOTE — ED Provider Notes (Signed)
[facility] Emergency Department Provider Note  ____________________________________________  Time seen: Approximately 7:40 PM  I have reviewed the triage vital signs and the nursing notes.   HISTORY  Chief Complaint No chief complaint on file.    HPI Regina Baker is a 24 y.o. female that presents to the emergency department for evaluation of knee injury.  Patient states that about 10 days ago she was at a wedding and bent down and felt the side of her left knee pop.  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 Lovena Le at [place].  She called Medicare and was recommended to come to the emergency department for a referral back to [facility].  Past Medical History:  Diagnosis Date  . Constipation     Patient Active Problem List   Diagnosis Date Noted  . Labor and delivery indication for care or intervention 10/24/2017  . Pregnancy 10/04/2017  . Abdominal pain in pregnancy, third trimester 08/19/2017  . First trimester screening     Past Surgical History:  Procedure Laterality Date  . KNEE SURGERY Right     Prior to Admission medications   Medication Sig Start Date End Date Taking? Authorizing Provider  benzocaine-Menthol (DERMOPLAST) 20-0.5 % AERO Apply 1 application topically as needed for irritation (perineal discomfort). 10/27/17   Schermerhorn, Gwen Her, MD  ibuprofen (ADVIL) 600 MG tablet Take 1 tablet (600 mg total) by mouth every 6 (six) hours as needed. 07/17/19   Laban Emperor, PA-C  norethindrone (ORTHO MICRONOR) 0.35 MG tablet Take 1 tablet (0.35 mg total) by mouth daily. 10/27/17 10/27/18  Schermerhorn, Gwen Her, MD  Prenatal Vit-Fe Fumarate-FA (PRENATAL MULTIVITAMIN) TABS tablet Take 1 tablet by mouth daily at 12 noon.    [provider]    Allergies Penicillins  No family history on  file.  Social History Social History   Tobacco Use  . Smoking status: Never Smoker  . Smokeless tobacco: Never Used  Substance Use Topics  . Alcohol use: No  . Drug use: No     Review of Systems  Cardiovascular: No chest pain. Respiratory: No SOB. Gastrointestinal: No nausea, no vomiting.  Musculoskeletal: Positive for knee pain. Skin: Negative for rash, abrasions, lacerations, ecchymosis. Neurological: Negative for headaches, numbness or tingling   ____________________________________________   PHYSICAL EXAM:  VITAL SIGNS: ED Triage Vitals  Enc Vitals Group     BP 07/17/19 1859 130/78     Pulse Rate 07/17/19 1859 80     Resp 07/17/19 1859 18     Temp 07/17/19 1859 98.7 F (37.1 C)     Temp Source 07/17/19 1859 Oral     SpO2 07/17/19 1859 99 %     Weight 07/17/19 1851 218 lb 14.7 oz (99.3 kg)     Height 07/17/19 1851 5\' 7"  (1.702 m)     Head Circumference --      Peak Flow --      Pain Score 07/17/19 1857 3     Pain Loc --      Pain Edu? --      Excl. in [place]? --      Constitutional: Alert and oriented. Well appearing and in no acute distress. Eyes: Conjunctivae are normal. PERRL. EOMI. Head: Atraumatic. ENT:      Ears:      Nose: No congestion/rhinnorhea.  Mouth/Throat: Mucous membranes are moist.  Neck: No stridor.  Cardiovascular: Normal rate, regular rhythm.  Good peripheral circulation. Respiratory: Normal respiratory effort without tachypnea or retractions. Lungs CTAB. Good air entry to the bases with no decreased or absent breath sounds. Musculoskeletal: Full range of motion to all extremities. No gross deformities appreciated. No tenderness to palpation. No effusion noted. Negative anterior drawer, posterior drawer, valgus, mcMurray, patella apprehension, apley grind. Positive varus. Neurologic:  Normal speech and language. No gross focal neurologic deficits are appreciated.  Skin:  Skin is warm, dry and intact. No rash noted. Psychiatric: Mood  and affect are normal. Speech and behavior are normal. Patient exhibits appropriate insight and judgement.   ____________________________________________   LABS (all labs ordered are listed, but only abnormal results are displayed)  Labs Reviewed - No data to display ____________________________________________  EKG   ____________________________________________  RADIOLOGY Lexine Baton, personally viewed and evaluated these images (plain radiographs) as part of my medical decision making, as well as reviewing the written report by the radiologist.  DG Knee Complete 4 Views Left  Result Date: 07/17/2019 CLINICAL DATA:  Left knee pain EXAM: LEFT KNEE - COMPLETE 4+ VIEW COMPARISON:  None. FINDINGS: No evidence of fracture, dislocation, or joint effusion. No evidence of arthropathy or other focal bone abnormality. Soft tissues are unremarkable. IMPRESSION: Negative. Electronically Signed   By: Jasmine Pang M.D.   On: 07/17/2019 19:40    ____________________________________________    PROCEDURES  Procedure(s) performed:    Procedures    Medications - No data to display   ____________________________________________   INITIAL IMPRESSION / ASSESSMENT AND PLAN / ED COURSE  Pertinent labs & imaging results that were available during my care of the patient were reviewed by me and considered in my medical decision making (see chart for details).  Review of the [place] CSRS was performed in accordance of the [facility] prior to dispensing any controlled drugs.   Patient presented to the emergency department for evaluation of knee injury.  Vital signs and exam are reassuring.  X-ray negative for acute bony abnormalities.  Ace wrap was placed.  Crutches were given.  Patient is requesting a referral to orthopedics.  Patient will be discharged home with prescriptions for Motrin. Patient is to follow up with orthopedics as directed. Patient is given ED precautions to return to the ED for  any worsening or new symptoms.   Regina Baker was evaluated in Emergency Department on 07/17/2019 for the symptoms described in the history of present illness. She was evaluated in the context of the global COVID-19 pandemic, which necessitated consideration that the patient might be at risk for infection with the SARS-CoV-2 virus that causes COVID-19. Institutional protocols and algorithms that pertain to the evaluation of patients at risk for COVID-19 are in a state of rapid change based on information released by regulatory bodies including the CDC and federal and state organizations. These policies and algorithms were followed during the patient's care in the ED.  ____________________________________________  FINAL CLINICAL IMPRESSION(S) / ED DIAGNOSES  Final diagnoses:  Injury of right knee, initial encounter      NEW MEDICATIONS STARTED DURING THIS VISIT:  ED Discharge Orders         Ordered    ibuprofen (ADVIL) 600 MG tablet  Every 6 hours PRN     07/17/19 2012              This chart was dictated using voice recognition software/Dragon. Despite best efforts to proofread, errors  can occur which can change the meaning. Any change was purely unintentional.    Enid Derry, PA-C 07/17/19 4784    Phineas Semen, MD 07/17/19 2232

## 2019-07-17 NOTE — ED Triage Notes (Signed)
See triage note  Presents with left knee pain  States she felt a "pop" to her knee   Then a burning sensation   Since she has developed pain to lateral aspect of her knee  Ambulates well at present

## 2019-11-19 ENCOUNTER — Ambulatory Visit
Admission: EM | Admit: 2019-11-19 | Discharge: 2019-11-19 | Disposition: A | Discharge status: Home / Self Care | Payer: Medicaid Other | Attending: Physician Assistant | Admitting: Physician Assistant

## 2019-11-19 ENCOUNTER — Other Ambulatory Visit: Payer: Self-pay

## 2019-11-19 ENCOUNTER — Encounter: Payer: Self-pay | Admitting: Emergency Medicine

## 2019-11-19 ENCOUNTER — Ambulatory Visit (INDEPENDENT_AMBULATORY_CARE_PROVIDER_SITE_OTHER): Payer: Medicaid Other

## 2019-11-19 DIAGNOSIS — M25472 Effusion, left ankle: Secondary | ICD-10-CM

## 2019-11-19 DIAGNOSIS — S93402A Sprain of unspecified ligament of left ankle, initial encounter: Secondary | ICD-10-CM | POA: Diagnosis not present

## 2019-11-19 DIAGNOSIS — M25572 Pain in left ankle and joints of left foot: Secondary | ICD-10-CM | POA: Diagnosis not present

## 2019-11-19 NOTE — ED Triage Notes (Signed)
Patient states she twisted her left ankle on Saturday. She is c/o pain and swelling to the ankle area.

## 2019-11-19 NOTE — Discharge Instructions (Addendum)
SPRAIN: Stressed avoiding painful activities . Reviewed RICE guidelines. Use medications as directed, including NSAIDs. If no NSAIDs have been prescribed for you today, you may take Aleve or Motrin over the counter. May use Tylenol in between doses of NSAIDs.  If no improvement in the next 1-2 weeks, f/u with PCP or return to our office for reexamination, and please feel free to call or return at any time for any questions or concerns you may have and we will be happy to help you!      You have a condition requiring you to follow up with Orthopedics so please call one of the following office for appointment:   [facility] [address], [place], [place] [address] Phone: [phone]  [facility] [address], [place],  [address] Phone: [phone]  

## 2019-11-19 NOTE — ED Provider Notes (Signed)
[facility]    CSN: [id number] Arrival date & time: 11/19/19  1659      History   Chief Complaint Chief Complaint  Patient presents with  . Ankle Injury    left    HPI Regina Baker is a 24 y.o. female.   Patient presents for injury of the left ankle 2 days ago.  She says she has had swelling and pain since twisting the ankle.  She believes she fell in a hole and twisted the ankle.  She says it hurts to walk on the foot.  She has been taking ibuprofen and icing the ankle without much relief.  Admits to occasional numbness to the ankle.  Says the ankle feels weak.  Admits to history of sprains in his ankle.  Denies any previous fracture of the ankle.  She has any other injuries, complaints or concerns.  HPI  Past Medical History:  Diagnosis Date  . Constipation     Patient Active Problem List   Diagnosis Date Noted  . Labor and delivery indication for care or intervention 10/24/2017  . Pregnancy 10/04/2017  . Abdominal pain in pregnancy, third trimester 08/19/2017  . First trimester screening     Past Surgical History:  Procedure Laterality Date  . KNEE SURGERY Right     OB History    Gravida  1   Para  1   Term  1   Preterm      AB      Living  1     SAB      TAB      Ectopic      Multiple  0   Live Births  1            Home Medications    Prior to Admission medications   Medication Sig Start Date End Date Taking? Authorizing Provider  ibuprofen (ADVIL) 600 MG tablet Take 1 tablet (600 mg total) by mouth every 6 (six) hours as needed. 07/17/19  Yes Enid Derry, PA-C  benzocaine-Menthol (DERMOPLAST) 20-0.5 % AERO Apply 1 application topically as needed for irritation (perineal discomfort). 10/27/17   Schermerhorn, Ihor Austin, MD  norethindrone (ORTHO MICRONOR) 0.35 MG tablet Take 1 tablet (0.35 mg total) by mouth daily. 10/27/17 10/27/18  Schermerhorn, Ihor Austin, MD  Prenatal Vit-Fe Fumarate-FA (PRENATAL MULTIVITAMIN) TABS tablet  Take 1 tablet by mouth daily at 12 noon.    [provider]    Family History Family History  Problem Relation Age of Onset  . Healthy Mother   . Healthy Father     Social History Social History   Tobacco Use  . Smoking status: Never Smoker  . Smokeless tobacco: Never Used  Substance Use Topics  . Alcohol use: No  . Drug use: No     Allergies   Penicillins   Review of Systems Review of Systems  Constitutional: Negative for fatigue.  Musculoskeletal: Positive for arthralgias, gait problem and joint swelling.  Skin: Negative for color change, rash and wound.  Neurological: Negative for weakness and numbness.     Physical Exam Triage Vital Signs ED Triage Vitals  Enc Vitals Group     BP 11/19/19 1818 120/64     Pulse Rate 11/19/19 1816 90     Resp 11/19/19 1816 18     Temp 11/19/19 1816 98.6 F (37 C)     Temp Source 11/19/19 1816 Oral     SpO2 11/19/19 1816 99 %     Weight  11/19/19 1814 230 lb (104.3 kg)     Height 11/19/19 1814 5\' 7"  (1.702 m)     Head Circumference --      Peak Flow --      Pain Score 11/19/19 1814 6     Pain Loc --      Pain Edu? --      Excl. in [place]? --    No data found.  Updated Vital Signs BP 120/64   Pulse 90   Temp 98.6 F (37 C) (Oral)   Resp 18   Ht 5\' 7"  (1.702 m)   Wt 230 lb (104.3 kg)   LMP 10/10/2019   SpO2 99%   BMI 36.02 kg/m    Physical Exam Vitals and nursing note reviewed.  Constitutional:      General: She is not in acute distress.    Appearance: Normal appearance. She is not ill-appearing or toxic-appearing.  HENT:     Head: Normocephalic and atraumatic.  Eyes:     General: No scleral icterus.       Right eye: No discharge.        Left eye: No discharge.     Conjunctiva/sclera: Conjunctivae normal.  Cardiovascular:     Rate and Rhythm: Normal rate and regular rhythm.     Pulses: Normal pulses.  Pulmonary:     Effort: Pulmonary effort is normal. No respiratory distress.  Musculoskeletal:      Cervical back: Neck supple.     Left ankle: Swelling (diffuse moderate swelling medial and lateral ankle) present. Tenderness present over the lateral malleolus, medial malleolus and ATF ligament. Decreased range of motion. Normal pulse.  Skin:    General: Skin is dry.  Neurological:     General: No focal deficit present.     Mental Status: She is alert. Mental status is at baseline.     Motor: No weakness.     Gait: Gait abnormal.  Psychiatric:        Mood and Affect: Mood normal.        Behavior: Behavior normal.        Thought Content: Thought content normal.      UC Treatments / Results  Labs (all labs ordered are listed, but only abnormal results are displayed) Labs Reviewed - No data to display  EKG   Radiology DG Ankle Complete Left  Result Date: 11/19/2019 CLINICAL DATA:  Left ankle pain and swelling after fall 2 days ago. EXAM: LEFT ANKLE COMPLETE - 3+ VIEW COMPARISON:  Radiograph 04/16/2017 FINDINGS: There is no evidence of fracture or dislocation. Normal alignment. The ankle mortise is preserved. Small ankle joint effusion. Generalized soft tissue edema. IMPRESSION: Generalized soft tissue edema and small joint effusion. No fracture. Electronically Signed   By: 11/21/2019 M.D.   On: 11/19/2019 19:13    Procedures Procedures (including critical care time)  Medications Ordered in UC Medications - No data to display  Initial Impression / Assessment and Plan / UC Course  I have reviewed the triage vital signs and the nursing notes.  Pertinent labs & imaging results that were available during my care of the patient were reviewed by me and considered in my medical decision making (see chart for details).   Imaging of ankle shows no fractures.  Advised patient she likely has ankle sprain.  Patient given lace up ankle brace.  Advised rest, ice, compression and elevation.  She should follow-up with orthopedics if she is having repeated ankle sprains.  She can  take Tylenol and Motrin for pain relief.  Please follow-up with [place] or return for any new or worsening symptoms of the next 2 weeks.   Final Clinical Impressions(s) / UC Diagnoses   Final diagnoses:  Sprain of left ankle, unspecified ligament, initial encounter  Left ankle swelling     Discharge Instructions     SPRAIN: Stressed avoiding painful activities . Reviewed RICE guidelines. Use medications as directed, including NSAIDs. If no NSAIDs have been prescribed for you today, you may take Aleve or Motrin over the counter. May use Tylenol in between doses of NSAIDs.  If no improvement in the next 1-2 weeks, f/u with PCP or return to our office for reexamination, and please feel free to call or return at any time for any questions or concerns you may have and we will be happy to help you!      You have a condition requiring you to follow up with Orthopedics so please call one of the following office for appointment:   [facility] [address] [place], [place] [address] Phone: [phone]  [facility] [address] [place], [place] [address] Phone: [phone]     ED Prescriptions    None     PDMP not reviewed this encounter.   Shirlee Latch, PA-C 11/19/19 1925

## 2021-09-01 ENCOUNTER — Ambulatory Visit: Payer: Self-pay | Admitting: General Surgery

## 2021-09-01 NOTE — H&P (Signed)
PATIENT PROFILE: Regina Baker is a 26 y.o. female who presents to the Clinic for consultation at the request of Dr. Johny Shock for evaluation of cholelithiasis.  PCP:  Valera Castle, MD  HISTORY OF PRESENT ILLNESS: Regina Baker reports having right upper arm pain since 3 years ago.  She endorses that after having her baby she has been having right upper cramping.  She had an ultrasound 3 years ago that shows no abnormality in the gallbladder.  She has been having right upper quadrant pain every time that she eats.  No pain radiation.  She cannot identify any alleviating factors.  Aggravating factor is eating and bending forward.  She denies any fever or chills.  Previous labs shows normal bilirubin.  She had an ultrasound 2 weeks ago that shows a small stone in the gallbladder.  I personally evaluated the images.  No the bladder thickening.  No pericholecystic fluid.   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   Obesity (BMI 30-39.9), unspecified Unknown   Chronic constipation Unknown   Knee effusion, right 10/02/2014   S/P ACL reconstruction 05/17/2011   Knee pain 05/13/2011    GENERAL REVIEW OF SYSTEMS:   General ROS: negative for - chills, fatigue, fever, weight gain or weight loss Allergy and Immunology ROS: negative for - hives  Hematological and Lymphatic ROS: negative for - bleeding problems or bruising, negative for palpable nodes Endocrine ROS: negative for - heat or cold intolerance, hair changes Respiratory ROS: negative for - cough, shortness of breath or wheezing Cardiovascular ROS: no chest pain or palpitations GI ROS: Positive for nausea, vomiting, abdominal pain, diarrhea, constipation Musculoskeletal ROS: negative for - joint swelling or muscle  pain Neurological ROS: negative for - confusion, syncope Dermatological ROS: negative for pruritus and rash Psychiatric: negative for anxiety, depression, difficulty sleeping and memory loss  MEDICATIONS: Current Outpatient Medications  Medication Sig Dispense Refill   lidocaine-diphenhydramine-aluminum-magnesium-simethicone (FIRST MOUTHWASH BLM) oral suspension Swish and spit 5 mLs every 6 (six) hours as needed (Patient not taking: Reported on 02/11/2020) 119 mL 0   norethindrone-ethinyl estradiol (JUNEL 1/20) 1-20 mg-mcg tablet Take 1 tablet by mouth once daily (Patient not taking: Reported on 09/01/2021) 1 Package 11   omeprazole (PRILOSEC) 40 MG DR capsule Take 1 capsule (40 mg total) by mouth 2 (two) times daily Take 1 tablet 30 mins before breakfast and 1 tablet 30 mins before dinner. (Patient not taking: Reported on 09/01/2021) 90 capsule 1   phenazopyridine HCl (AZO ORAL) Take by mouth (Patient not taking: Reported on 09/01/2021)     No current facility-administered medications for this visit.    ALLERGIES: Penicillins  PAST MEDICAL HISTORY: Past Medical History:  Diagnosis Date   Chronic constipation    Chronic hypertension affecting pregnancy    Obesity (BMI 30-39.9), unspecified     PAST SURGICAL HISTORY: Past Surgical History:  Procedure Laterality Date   KNEE ARTHROSCOPY Right 04/20/2011   Menisectomy - Lateral, meniscus repair-medial   ARTHROSCOPIC REPAIR ACL Right    KNEE ARTHROSCOPY Right      FAMILY HISTORY: Family History  Problem Relation Age of Onset   Thyroid cancer Mother    Ovarian cysts Mother    Cholelithiasis Mother    High blood  pressure (Hypertension) Mother    High blood pressure (Hypertension) Father    Cholelithiasis Father    No Known Problems Sister    No Known Problems Brother    High blood pressure (Hypertension) Maternal Grandmother    Thyroid cancer Maternal Grandmother    Cholelithiasis Paternal Grandmother    Constipation Paternal  Grandmother    Diabetes type II Paternal Grandfather    High blood pressure (Hypertension) Paternal Grandfather    Hyperlipidemia (Elevated cholesterol) Paternal Grandfather    Ovarian cysts Paternal Grandfather    Osteoarthritis Paternal Grandfather    Colon polyps Neg Hx    Colon cancer Neg Hx    Inflammatory bowel disease Neg Hx      SOCIAL HISTORY: Social History   Socioeconomic History   Marital status: Married   Years of education: 77  Occupational History   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 Weight: 100.7 kg (222 lb)   GENERAL: Alert, active, oriented x3  HEENT: Pupils equal reactive to light. Extraocular movements are intact. Sclera clear. Palpebral conjunctiva normal red color.Pharynx clear.  NECK: Supple with no palpable mass and no adenopathy.  LUNGS: Sound clear with no rales rhonchi or wheezes.  HEART: Regular rhythm S1 and S2 without murmur.  ABDOMEN: Soft and depressible, nontender with no palpable mass, no hepatomegaly.   EXTREMITIES: Well-developed well-nourished symmetrical with no dependent edema.  NEUROLOGICAL: Awake alert oriented, facial expression symmetrical, moving all extremities.  REVIEW OF DATA: I have reviewed the following data today: Office Visit on 06/08/2021  Component Date Value   Color 06/08/2021 Pink (!)    Clarity 06/08/2021 Turbid (!)    Specific Gravity 06/08/2021 1.015    pH, Urine 06/08/2021 5.0    Protein, Urinalysis 06/08/2021 3+ (!)    Glucose, Urinalysis 06/08/2021 1+ (!)     Ketones, Urinalysis 06/08/2021 1+ (!)    Blood, Urinalysis 06/08/2021 3+ (!)    Nitrite, Urinalysis 06/08/2021 Positive (!)    Leukocytes, Urinalysis 06/08/2021 3+ (!)    Bilirubin, Urinalysis 06/08/2021 2+ (!)    Urobilinogen, Urinalysis 06/08/2021 >=8.0 (H)    Culture Urine 06/08/2021 >=100,000 colonies/mL Escherichia coli (!)    WBC (White Blood Cell Co* 06/08/2021 11.5 (H)    Hemoglobin 06/08/2021 14.8    Hematocrit 06/08/2021 43.3    Platelets 06/08/2021 331    MCV (Mean Corpuscular Vo* 06/08/2021 88    MCH (Mean Corpuscular He* 06/08/2021 30.0    MCHC (Mean Corpuscular H* 06/08/2021 34.2    RBC (Red Blood Cell Coun* 06/08/2021 4.94    RDW-CV (Red Cell Distrib* 06/08/2021 12.4    NRBC (Nucleated Red Bloo* 06/08/2021 0.00    NRBC % (Nucleated Red Bl* 06/08/2021 0.0    MPV (Mean Platelet Volum* 06/08/2021 10.2    Neutrophil Count 06/08/2021 8.6    Neutrophil % 06/08/2021 74.6    Lymphocyte Count 06/08/2021 1.9    Lymphocyte % 06/08/2021 16.5    Monocyte Count 06/08/2021 0.8  Monocyte % 06/08/2021 6.8    Eosinophil Count 06/08/2021 0.16    Eosinophil % 06/08/2021 1.4    Basophil Count 06/08/2021 0.04    Basophil % 06/08/2021 0.3    Immature Granulocyte Cou* 06/08/2021 0.05    Immature Granulocyte % 06/08/2021 0.4    Sodium 06/08/2021 137    Potassium 06/08/2021 3.7    Chloride 06/08/2021 103    Carbon Dioxide (CO2) 06/08/2021 23    Urea Nitrogen (BUN) 06/08/2021 6 (L)    Creatinine 06/08/2021 1.0    Glucose 06/08/2021 100    Calcium 06/08/2021 9.1    Anion Gap 06/08/2021 11    BUN/CREA Ratio 06/08/2021 6    Glomerular Filtration Ra* 06/08/2021 81    Protein, Total 06/08/2021 6.8    Albumin 06/08/2021 3.8    Bilirubin, Total 06/08/2021 0.6    Bilirubin, Conjugated 06/08/2021 <0.1 (L)    Alk Phos (Alkaline Phosp* 06/08/2021 91    AST (Aspartate Aminotran* 06/08/2021 20    ALT (Alanine Aminotransf* 06/08/2021 29    Hemoglobin A1C 06/08/2021 5.4    Average Blood  Glucose (C* 06/08/2021 105      ASSESSMENT: Ms. Boehne is a 26 y.o. female presenting for consultation for cholelithiasis.    Patient was oriented about the diagnosis of cholelithiasis. Also oriented about what is the gallbladder, its anatomy and function and the implications of having stones / gallbladder low ejection fraction. The patient was oriented about the treatment alternatives (observation vs cholecystectomy). Patient was oriented that a low percentage of patient will continue to have similar pain symptoms even after the gallbladder is removed. Surgical technique (open vs laparoscopic) was discussed. It was also discussed the goals of the surgery (decrease the pain episodes and avoid the risk of cholecystitis) and the risk of surgery including: bleeding, infection, common bile duct injury, stone retention, injury to other organs such as bowel, liver, stomach, other complications such as hernia, bowel obstruction among others. Also discussed with patient about anesthesia and its complications such as: reaction to medications, pneumonia, heart complications, death, among others.   As per patient request she will be scheduled in August.  Cholelithiasis without cholecystitis [K80.20]  PLAN: 1.  Robotic assisted laparoscopic cholecystectomy (49675) 2.  Do not take aspirin 5 days before the procedure 3.  Contact us if has any question or concern.   Patient verbalized understanding, all questions were answered, and were agreeable with the plan outlined above.     Herbert Pun, MD  Electronically signed by Herbert Pun, MD

## 2021-09-01 NOTE — H&P (View-Only) (Signed)
PATIENT PROFILE: Regina Baker is a 26 y.o. female who presents to the Clinic for consultation at the request of Dr. Johny Shock for evaluation of cholelithiasis.  PCP:  Regina Castle, MD  HISTORY OF PRESENT ILLNESS: Regina Baker reports having right upper arm pain since 3 years ago.  She endorses that after having her baby she has been having right upper cramping.  She had an ultrasound 3 years ago that shows no abnormality in the gallbladder.  She has been having right upper quadrant pain every time that she eats.  No pain radiation.  She cannot identify any alleviating factors.  Aggravating factor is eating and bending forward.  She denies any fever or chills.  Previous labs shows normal bilirubin.  She had an ultrasound 2 weeks ago that shows a small stone in the gallbladder.  I personally evaluated the images.  No the bladder thickening.  No pericholecystic fluid.   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   Obesity (BMI 30-39.9), unspecified Unknown   Chronic constipation Unknown   Knee effusion, right 10/02/2014   S/P ACL reconstruction 05/17/2011   Knee pain 05/13/2011    GENERAL REVIEW OF SYSTEMS:   General ROS: negative for - chills, fatigue, fever, weight gain or weight loss Allergy and Immunology ROS: negative for - hives  Hematological and Lymphatic ROS: negative for - bleeding problems or bruising, negative for palpable nodes Endocrine ROS: negative for - heat or cold intolerance, hair changes Respiratory ROS: negative for - cough, shortness of breath or wheezing Cardiovascular ROS: no chest pain or palpitations GI ROS: Positive for nausea, vomiting, abdominal pain, diarrhea, constipation Musculoskeletal ROS: negative for - joint swelling or muscle  pain Neurological ROS: negative for - confusion, syncope Dermatological ROS: negative for pruritus and rash Psychiatric: negative for anxiety, depression, difficulty sleeping and memory loss  MEDICATIONS: Current Outpatient Medications  Medication Sig Dispense Refill   lidocaine-diphenhydramine-aluminum-magnesium-simethicone (FIRST MOUTHWASH BLM) oral suspension Swish and spit 5 mLs every 6 (six) hours as needed (Patient not taking: Reported on 02/11/2020) 119 mL 0   norethindrone-ethinyl estradiol (JUNEL 1/20) 1-20 mg-mcg tablet Take 1 tablet by mouth once daily (Patient not taking: Reported on 09/01/2021) 1 Package 11   omeprazole (PRILOSEC) 40 MG DR capsule Take 1 capsule (40 mg total) by mouth 2 (two) times daily Take 1 tablet 30 mins before breakfast and 1 tablet 30 mins before dinner. (Patient not taking: Reported on 09/01/2021) 90 capsule 1   phenazopyridine HCl (AZO ORAL) Take by mouth (Patient not taking: Reported on 09/01/2021)     No current facility-administered medications for this visit.    ALLERGIES: Penicillins  PAST MEDICAL HISTORY: Past Medical History:  Diagnosis Date   Chronic constipation    Chronic hypertension affecting pregnancy    Obesity (BMI 30-39.9), unspecified     PAST SURGICAL HISTORY: Past Surgical History:  Procedure Laterality Date   KNEE ARTHROSCOPY Right 04/20/2011   Menisectomy - Lateral, meniscus repair-medial   ARTHROSCOPIC REPAIR ACL Right    KNEE ARTHROSCOPY Right      FAMILY HISTORY: Family History  Problem Relation Age of Onset   Thyroid cancer Mother    Ovarian cysts Mother    Cholelithiasis Mother    High blood  pressure (Hypertension) Mother    High blood pressure (Hypertension) Father    Cholelithiasis Father    No Known Problems Sister    No Known Problems Brother    High blood pressure (Hypertension) Maternal Grandmother    Thyroid cancer Maternal Grandmother    Cholelithiasis Paternal Grandmother    Constipation Paternal  Grandmother    Diabetes type II Paternal Grandfather    High blood pressure (Hypertension) Paternal Grandfather    Hyperlipidemia (Elevated cholesterol) Paternal Grandfather    Ovarian cysts Paternal Grandfather    Osteoarthritis Paternal Grandfather    Colon polyps Neg Hx    Colon cancer Neg Hx    Inflammatory bowel disease Neg Hx      SOCIAL HISTORY: Social History   Socioeconomic History   Marital status: Married   Years of education: 77  Occupational History   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 Weight: 100.7 kg (222 lb)   GENERAL: Alert, active, oriented x3  HEENT: Pupils equal reactive to light. Extraocular movements are intact. Sclera clear. Palpebral conjunctiva normal red color.Pharynx clear.  NECK: Supple with no palpable mass and no adenopathy.  LUNGS: Sound clear with no rales rhonchi or wheezes.  HEART: Regular rhythm S1 and S2 without murmur.  ABDOMEN: Soft and depressible, nontender with no palpable mass, no hepatomegaly.   EXTREMITIES: Well-developed well-nourished symmetrical with no dependent edema.  NEUROLOGICAL: Awake alert oriented, facial expression symmetrical, moving all extremities.  REVIEW OF DATA: I have reviewed the following data today: Office Visit on 06/08/2021  Component Date Value   Color 06/08/2021 Pink (!)    Clarity 06/08/2021 Turbid (!)    Specific Gravity 06/08/2021 1.015    pH, Urine 06/08/2021 5.0    Protein, Urinalysis 06/08/2021 3+ (!)    Glucose, Urinalysis 06/08/2021 1+ (!)     Ketones, Urinalysis 06/08/2021 1+ (!)    Blood, Urinalysis 06/08/2021 3+ (!)    Nitrite, Urinalysis 06/08/2021 Positive (!)    Leukocytes, Urinalysis 06/08/2021 3+ (!)    Bilirubin, Urinalysis 06/08/2021 2+ (!)    Urobilinogen, Urinalysis 06/08/2021 >=8.0 (H)    Culture Urine 06/08/2021 >=100,000 colonies/mL Escherichia coli (!)    WBC (White Blood Cell Co* 06/08/2021 11.5 (H)    Hemoglobin 06/08/2021 14.8    Hematocrit 06/08/2021 43.3    Platelets 06/08/2021 331    MCV (Mean Corpuscular Vo* 06/08/2021 88    MCH (Mean Corpuscular He* 06/08/2021 30.0    MCHC (Mean Corpuscular H* 06/08/2021 34.2    RBC (Red Blood Cell Coun* 06/08/2021 4.94    RDW-CV (Red Cell Distrib* 06/08/2021 12.4    NRBC (Nucleated Red Bloo* 06/08/2021 0.00    NRBC % (Nucleated Red Bl* 06/08/2021 0.0    MPV (Mean Platelet Volum* 06/08/2021 10.2    Neutrophil Count 06/08/2021 8.6    Neutrophil % 06/08/2021 74.6    Lymphocyte Count 06/08/2021 1.9    Lymphocyte % 06/08/2021 16.5    Monocyte Count 06/08/2021 0.8  Monocyte % 06/08/2021 6.8    Eosinophil Count 06/08/2021 0.16    Eosinophil % 06/08/2021 1.4    Basophil Count 06/08/2021 0.04    Basophil % 06/08/2021 0.3    Immature Granulocyte Cou* 06/08/2021 0.05    Immature Granulocyte % 06/08/2021 0.4    Sodium 06/08/2021 137    Potassium 06/08/2021 3.7    Chloride 06/08/2021 103    Carbon Dioxide (CO2) 06/08/2021 23    Urea Nitrogen (BUN) 06/08/2021 6 (L)    Creatinine 06/08/2021 1.0    Glucose 06/08/2021 100    Calcium 06/08/2021 9.1    Anion Gap 06/08/2021 11    BUN/CREA Ratio 06/08/2021 6    Glomerular Filtration Ra* 06/08/2021 81    Protein, Total 06/08/2021 6.8    Albumin 06/08/2021 3.8    Bilirubin, Total 06/08/2021 0.6    Bilirubin, Conjugated 06/08/2021 <0.1 (L)    Alk Phos (Alkaline Phosp* 06/08/2021 91    AST (Aspartate Aminotran* 06/08/2021 20    ALT (Alanine Aminotransf* 06/08/2021 29    Hemoglobin A1C 06/08/2021 5.4    Average Blood  Glucose (C* 06/08/2021 105      ASSESSMENT: Ms. Boehne is a 26 y.o. female presenting for consultation for cholelithiasis.    Patient was oriented about the diagnosis of cholelithiasis. Also oriented about what is the gallbladder, its anatomy and function and the implications of having stones / gallbladder low ejection fraction. The patient was oriented about the treatment alternatives (observation vs cholecystectomy). Patient was oriented that a low percentage of patient will continue to have similar pain symptoms even after the gallbladder is removed. Surgical technique (open vs laparoscopic) was discussed. It was also discussed the goals of the surgery (decrease the pain episodes and avoid the risk of cholecystitis) and the risk of surgery including: bleeding, infection, common bile duct injury, stone retention, injury to other organs such as bowel, liver, stomach, other complications such as hernia, bowel obstruction among others. Also discussed with patient about anesthesia and its complications such as: reaction to medications, pneumonia, heart complications, death, among others.   As per patient request she will be scheduled in August.  Cholelithiasis without cholecystitis [K80.20]  PLAN: 1.  Robotic assisted laparoscopic cholecystectomy (49675) 2.  Do not take aspirin 5 days before the procedure 3.  Contact us if has any question or concern.   Patient verbalized understanding, all questions were answered, and were agreeable with the plan outlined above.     Herbert Pun, MD  Electronically signed by Herbert Pun, MD

## 2021-09-16 ENCOUNTER — Encounter
Admission: RE | Admit: 2021-09-16 | Discharge: 2021-09-16 | Disposition: A | Discharge status: Home / Self Care | Payer: Medicaid Other | Source: Ambulatory Visit | Attending: General Surgery | Admitting: General Surgery

## 2021-09-16 ENCOUNTER — Other Ambulatory Visit: Payer: Self-pay

## 2021-09-16 DIAGNOSIS — Z01812 Encounter for preprocedural laboratory examination: Secondary | ICD-10-CM

## 2021-09-16 NOTE — Patient Instructions (Addendum)
Your procedure is scheduled on: 09/25/21 - Friday Report to the Registration Desk on the 1st floor of the [facility]. To find out your arrival time, please call [phone] between 1PM - 3PM on: 09/24/21 - Thursday If your arrival time is 6:00 am, do not arrive prior to that time as the [facility] entrance doors do not open until 6:00 am.  REMEMBER: Instructions that are not followed completely may result in serious medical risk, up to and including death; or upon the discretion of your surgeon and anesthesiologist your surgery may need to be rescheduled.  Do not eat food or drink any fluids after midnight the night before surgery.  No gum chewing, lozengers or hard candies.  TAKE THESE MEDICATIONS THE MORNING OF SURGERY WITH A SIP OF WATER: NONE  One week prior to surgery: Stop Anti-inflammatories (NSAIDS) such as Advil, Aleve, Ibuprofen, Motrin, Naproxen, Naprosyn and Aspirin based products such as Excedrin, Goodys Powder, BC Powder.    Stop ANY OVER THE COUNTER supplements until after surgery.  You may take Tylenol if needed for pain up until the day of surgery.  No Alcohol for 24 hours before or after surgery.  No Smoking including e-cigarettes for 24 hours prior to surgery.  No chewable tobacco products for at least 6 hours prior to surgery.  No nicotine patches on the day of surgery.  Do not use any "recreational" drugs for at least a week prior to your surgery.  Please be advised that the combination of cocaine and anesthesia may have negative outcomes, up to and including death. If you test positive for cocaine, your surgery will be cancelled.  On the morning of surgery brush your teeth with toothpaste and water, you may rinse your mouth with mouthwash if you wish. Do not swallow any toothpaste or mouthwash.  Use CHG Soap or wipes as directed on instruction sheet.  Do not wear jewelry, make-up, hairpins, clips or nail polish.  Do not wear lotions, powders, or  perfumes.   Do not shave body from the neck down 48 hours prior to surgery just in case you cut yourself which could leave a site for infection.  Also, freshly shaved skin may become irritated if using the CHG soap.  Contact lenses, hearing aids and dentures may not be worn into surgery.  Do not bring valuables to the hospital. [facility] is not responsible for any missing/lost belongings or valuables.   Notify your doctor if there is any change in your medical condition (cold, fever, infection).  Wear comfortable clothing (specific to your surgery type) to the hospital.  After surgery, you can help prevent lung complications by doing breathing exercises.  Take deep breaths and cough every 1-2 hours. Your doctor may order a device called an Incentive Spirometer to help you take deep breaths. When coughing or sneezing, hold a pillow firmly against your incision with both hands. This is called "splinting." Doing this helps protect your incision. It also decreases belly discomfort.  If you are being admitted to the hospital overnight, leave your suitcase in the car. After surgery it may be brought to your room.  If you are being discharged the day of surgery, you will not be allowed to drive home. You will need a responsible adult (18 years or older) to drive you home and stay with you that night.   If you are taking public transportation, you will need to have a responsible adult (18 years or older) with you. Please confirm with  your physician that it is acceptable to use public transportation.   Please call the Pre-admissions Testing Dept. at [phone] if you have any questions about these instructions.  Surgery Visitation Policy:  Patients undergoing a surgery or procedure may have two family members or support persons with them as long as the person is not COVID-19 positive or experiencing its symptoms.   Inpatient Visitation:    Visiting hours are 7 a.m. to 8 p.m. Up to  four visitors are allowed at one time in a patient room, including children. The visitors may rotate out with other people during the day. One designated support person (adult) may remain overnight.

## 2021-09-20 MED ORDER — INDOCYANINE GREEN 25 MG IV SOLR
1.2500 mg | Freq: Once | INTRAVENOUS | Status: AC
  Administered 2021-09-21: 1.25 mg via INTRAVENOUS
  Filled 2021-09-20: qty 0.5

## 2021-09-21 ENCOUNTER — Encounter
Admission: RE | Disposition: A | Discharge status: Home / Self Care | Payer: Self-pay | Source: Home / Self Care | Attending: General Surgery

## 2021-09-21 ENCOUNTER — Ambulatory Visit
Admission: RE | Admit: 2021-09-21 | Discharge: 2021-09-21 | Disposition: A | Discharge status: Home / Self Care | Payer: Medicaid Other | Attending: General Surgery | Admitting: General Surgery

## 2021-09-21 ENCOUNTER — Ambulatory Visit: Payer: Medicaid Other | Admitting: Certified Registered Nurse Anesthetist

## 2021-09-21 ENCOUNTER — Other Ambulatory Visit: Payer: Self-pay

## 2021-09-21 ENCOUNTER — Encounter: Payer: Self-pay | Admitting: General Surgery

## 2021-09-21 DIAGNOSIS — K219 Gastro-esophageal reflux disease without esophagitis: Secondary | ICD-10-CM | POA: Diagnosis not present

## 2021-09-21 DIAGNOSIS — K8010 Calculus of gallbladder with chronic cholecystitis without obstruction: Secondary | ICD-10-CM | POA: Insufficient documentation

## 2021-09-21 DIAGNOSIS — Z01812 Encounter for preprocedural laboratory examination: Secondary | ICD-10-CM

## 2021-09-21 LAB — POCT PREGNANCY, URINE: Preg Test, Ur: NEGATIVE

## 2021-09-21 SURGERY — CHOLECYSTECTOMY, ROBOT-ASSISTED, LAPAROSCOPIC
Anesthesia: General | Site: Abdomen

## 2021-09-21 MED ORDER — OXYCODONE HCL 5 MG PO TABS
ORAL_TABLET | ORAL | Status: AC
  Filled 2021-09-21: qty 1

## 2021-09-21 MED ORDER — CEFAZOLIN SODIUM-DEXTROSE 2-4 GM/100ML-% IV SOLN
INTRAVENOUS | Status: AC
  Filled 2021-09-21: qty 100

## 2021-09-21 MED ORDER — ACETAMINOPHEN 10 MG/ML IV SOLN
INTRAVENOUS | Status: DC | PRN
  Administered 2021-09-21: 1000 mg via INTRAVENOUS

## 2021-09-21 MED ORDER — BUPIVACAINE-EPINEPHRINE 0.25% -1:200000 IJ SOLN
INTRAMUSCULAR | Status: DC | PRN
  Administered 2021-09-21: 30 mL

## 2021-09-21 MED ORDER — DEXAMETHASONE SODIUM PHOSPHATE 10 MG/ML IJ SOLN
INTRAMUSCULAR | Status: DC | PRN
  Administered 2021-09-21: 10 mg via INTRAVENOUS

## 2021-09-21 MED ORDER — CEFAZOLIN SODIUM-DEXTROSE 2-4 GM/100ML-% IV SOLN
2.0000 g | INTRAVENOUS | Status: AC
  Administered 2021-09-21: 2 g via INTRAVENOUS

## 2021-09-21 MED ORDER — MIDAZOLAM HCL 2 MG/2ML IJ SOLN
INTRAMUSCULAR | Status: DC | PRN
  Administered 2021-09-21: 2 mg via INTRAVENOUS

## 2021-09-21 MED ORDER — LIDOCAINE HCL (PF) 2 % IJ SOLN
INTRAMUSCULAR | Status: AC
  Filled 2021-09-21: qty 5

## 2021-09-21 MED ORDER — SUGAMMADEX SODIUM 200 MG/2ML IV SOLN
INTRAVENOUS | Status: DC | PRN
  Administered 2021-09-21: 199.6 mg via INTRAVENOUS

## 2021-09-21 MED ORDER — OXYCODONE-ACETAMINOPHEN 5-325 MG PO TABS: 1.0000 | ORAL_TABLET | ORAL | 0 refills | Status: AC | PRN

## 2021-09-21 MED ORDER — BUPIVACAINE-EPINEPHRINE (PF) 0.25% -1:200000 IJ SOLN
INTRAMUSCULAR | Status: AC
  Filled 2021-09-21: qty 30

## 2021-09-21 MED ORDER — ROCURONIUM BROMIDE 100 MG/10ML IV SOLN
INTRAVENOUS | Status: DC | PRN
  Administered 2021-09-21: 50 mg via INTRAVENOUS

## 2021-09-21 MED ORDER — HYDROMORPHONE HCL 1 MG/ML IJ SOLN
INTRAMUSCULAR | Status: AC
  Filled 2021-09-21: qty 1

## 2021-09-21 MED ORDER — FENTANYL CITRATE (PF) 100 MCG/2ML IJ SOLN
INTRAMUSCULAR | Status: AC
  Filled 2021-09-21: qty 2

## 2021-09-21 MED ORDER — GLYCOPYRROLATE 0.2 MG/ML IJ SOLN
INTRAMUSCULAR | Status: DC | PRN
  Administered 2021-09-21: .2 mg via INTRAVENOUS

## 2021-09-21 MED ORDER — ORAL CARE MOUTH RINSE: 15.0000 mL | Freq: Once | OROMUCOSAL | Status: AC

## 2021-09-21 MED ORDER — KETOROLAC TROMETHAMINE 30 MG/ML IJ SOLN
INTRAMUSCULAR | Status: DC | PRN
  Administered 2021-09-21: 30 mg via INTRAVENOUS

## 2021-09-21 MED ORDER — CHLORHEXIDINE GLUCONATE 0.12 % MT SOLN: 15.0000 mL | Freq: Once | OROMUCOSAL | Status: AC

## 2021-09-21 MED ORDER — FENTANYL CITRATE (PF) 100 MCG/2ML IJ SOLN
INTRAMUSCULAR | Status: DC | PRN
  Administered 2021-09-21 (×2): 50 ug via INTRAVENOUS

## 2021-09-21 MED ORDER — FAMOTIDINE 20 MG PO TABS: 20.0000 mg | ORAL_TABLET | Freq: Once | ORAL | Status: AC

## 2021-09-21 MED ORDER — PROMETHAZINE HCL 25 MG/ML IJ SOLN: 6.2500 mg | INTRAMUSCULAR | Status: DC | PRN

## 2021-09-21 MED ORDER — ACETAMINOPHEN 10 MG/ML IV SOLN
INTRAVENOUS | Status: AC
  Filled 2021-09-21: qty 100

## 2021-09-21 MED ORDER — OXYCODONE HCL 5 MG PO TABS
5.0000 mg | ORAL_TABLET | Freq: Once | ORAL | Status: AC
  Administered 2021-09-21: 5 mg via ORAL

## 2021-09-21 MED ORDER — LACTATED RINGERS IV SOLN: INTRAVENOUS | Status: DC

## 2021-09-21 MED ORDER — FENTANYL CITRATE (PF) 100 MCG/2ML IJ SOLN
INTRAMUSCULAR | Status: AC
  Administered 2021-09-21: 50 ug via INTRAVENOUS
  Filled 2021-09-21: qty 2

## 2021-09-21 MED ORDER — PHENYLEPHRINE HCL (PRESSORS) 10 MG/ML IV SOLN
INTRAVENOUS | Status: DC | PRN
  Administered 2021-09-21 (×3): 80 ug via INTRAVENOUS

## 2021-09-21 MED ORDER — LIDOCAINE HCL (CARDIAC) PF 100 MG/5ML IV SOSY
PREFILLED_SYRINGE | INTRAVENOUS | Status: DC | PRN
  Administered 2021-09-21: 100 mg via INTRAVENOUS

## 2021-09-21 MED ORDER — MIDAZOLAM HCL 2 MG/2ML IJ SOLN
INTRAMUSCULAR | Status: AC
  Filled 2021-09-21: qty 2

## 2021-09-21 MED ORDER — HYDROMORPHONE HCL 1 MG/ML IJ SOLN
INTRAMUSCULAR | Status: DC | PRN
  Administered 2021-09-21 (×2): .5 mg via INTRAVENOUS

## 2021-09-21 MED ORDER — PROPOFOL 10 MG/ML IV BOLUS
INTRAVENOUS | Status: AC
  Filled 2021-09-21: qty 20

## 2021-09-21 MED ORDER — FENTANYL CITRATE (PF) 100 MCG/2ML IJ SOLN
25.0000 ug | INTRAMUSCULAR | Status: DC | PRN
  Administered 2021-09-21: 50 ug via INTRAVENOUS

## 2021-09-21 MED ORDER — ROCURONIUM BROMIDE 10 MG/ML (PF) SYRINGE
PREFILLED_SYRINGE | INTRAVENOUS | Status: AC
  Filled 2021-09-21: qty 10

## 2021-09-21 MED ORDER — PROPOFOL 10 MG/ML IV BOLUS
INTRAVENOUS | Status: DC | PRN
  Administered 2021-09-21: 200 mg via INTRAVENOUS

## 2021-09-21 MED ORDER — FAMOTIDINE 20 MG PO TABS
ORAL_TABLET | ORAL | Status: AC
  Administered 2021-09-21: 20 mg via ORAL
  Filled 2021-09-21: qty 1

## 2021-09-21 MED ORDER — CHLORHEXIDINE GLUCONATE 0.12 % MT SOLN
OROMUCOSAL | Status: AC
  Administered 2021-09-21: 15 mL via OROMUCOSAL
  Filled 2021-09-21: qty 15

## 2021-09-21 MED ORDER — KETOROLAC TROMETHAMINE 30 MG/ML IJ SOLN
INTRAMUSCULAR | Status: AC
  Filled 2021-09-21: qty 1

## 2021-09-21 MED ORDER — ONDANSETRON HCL 4 MG/2ML IJ SOLN
INTRAMUSCULAR | Status: DC | PRN
  Administered 2021-09-21: 4 mg via INTRAVENOUS

## 2021-09-21 MED ORDER — DEXMEDETOMIDINE (PRECEDEX) IN NS 20 MCG/5ML (4 MCG/ML) IV SYRINGE
PREFILLED_SYRINGE | INTRAVENOUS | Status: DC | PRN
  Administered 2021-09-21: 8 ug via INTRAVENOUS
  Administered 2021-09-21: 12 ug via INTRAVENOUS
  Administered 2021-09-21: 8 ug via INTRAVENOUS
  Administered 2021-09-21: 10 ug via INTRAVENOUS

## 2021-09-21 SURGICAL SUPPLY — 55 items
ADH SKN CLS APL DERMABOND .7 (GAUZE/BANDAGES/DRESSINGS) ×1
BAG PRESSURE INF REUSE 1000 (BAG) IMPLANT
BLADE SURG SZ11 CARB STEEL (BLADE) ×2 IMPLANT
CANNULA REDUC XI 12-8 STAPL (CANNULA) ×1
CANNULA REDUCER 12-8 DVNC XI (CANNULA) ×1 IMPLANT
CATH REDDICK CHOLANGI 4FR 50CM (CATHETERS) IMPLANT
CLIP LIGATING HEM O LOK PURPLE (MISCELLANEOUS) IMPLANT
CLIP LIGATING HEMO O LOK GREEN (MISCELLANEOUS) ×2 IMPLANT
DERMABOND ADVANCED (GAUZE/BANDAGES/DRESSINGS) ×1
DERMABOND ADVANCED .7 DNX12 (GAUZE/BANDAGES/DRESSINGS) ×1 IMPLANT
DRAPE ARM DVNC X/XI (DISPOSABLE) ×4 IMPLANT
DRAPE C-ARM XRAY 36X54 (DRAPES) IMPLANT
DRAPE COLUMN DVNC XI (DISPOSABLE) ×1 IMPLANT
DRAPE DA VINCI XI ARM (DISPOSABLE) ×4
DRAPE DA VINCI XI COLUMN (DISPOSABLE) ×1
ELECT REM PT RETURN 9FT ADLT (ELECTROSURGICAL) ×2
ELECTRODE REM PT RTRN 9FT ADLT (ELECTROSURGICAL) ×1 IMPLANT
GLOVE BIO SURGEON STRL SZ 6.5 (GLOVE) ×6 IMPLANT
GLOVE BIOGEL PI IND STRL 6.5 (GLOVE) ×2 IMPLANT
GLOVE BIOGEL PI INDICATOR 6.5 (GLOVE) ×4
GOWN STRL REUS W/ TWL LRG LVL3 (GOWN DISPOSABLE) ×3 IMPLANT
GOWN STRL REUS W/TWL LRG LVL3 (GOWN DISPOSABLE) ×8
GRASPER SUT TROCAR 14GX15 (MISCELLANEOUS) ×2 IMPLANT
IRRIGATOR SUCT 8 DISP DVNC XI (IRRIGATION / IRRIGATOR) IMPLANT
IRRIGATOR SUCTION 8MM XI DISP (IRRIGATION / IRRIGATOR)
IV CATH ANGIO 12GX3 LT BLUE (NEEDLE) IMPLANT
IV NS 1000ML (IV SOLUTION)
IV NS 1000ML BAXH (IV SOLUTION) IMPLANT
KIT PINK PAD W/HEAD ARE REST (MISCELLANEOUS) ×2
KIT PINK PAD W/HEAD ARM REST (MISCELLANEOUS) ×1 IMPLANT
LABEL OR SOLS (LABEL) ×2 IMPLANT
MANIFOLD NEPTUNE II (INSTRUMENTS) ×2 IMPLANT
NDL INSUFFLATION 14GA 120MM (NEEDLE) ×1 IMPLANT
NEEDLE HYPO 22GX1.5 SAFETY (NEEDLE) ×2 IMPLANT
NEEDLE INSUFFLATION 14GA 120MM (NEEDLE) ×2 IMPLANT
NS IRRIG 500ML POUR BTL (IV SOLUTION) ×2 IMPLANT
OBTURATOR OPTICAL STANDARD 8MM (TROCAR) ×1
OBTURATOR OPTICAL STND 8 DVNC (TROCAR) ×1
OBTURATOR OPTICALSTD 8 DVNC (TROCAR) ×1 IMPLANT
PACK LAP CHOLECYSTECTOMY (MISCELLANEOUS) ×2 IMPLANT
SEAL CANN UNIV 5-8 DVNC XI (MISCELLANEOUS) ×3 IMPLANT
SEAL XI 5MM-8MM UNIVERSAL (MISCELLANEOUS) ×3
SET TUBE SMOKE EVAC HIGH FLOW (TUBING) ×2 IMPLANT
SOLUTION ELECTROLUBE (MISCELLANEOUS) ×2 IMPLANT
SPIKE FLUID TRANSFER (MISCELLANEOUS) ×2 IMPLANT
SPONGE T-LAP 4X18 ~~LOC~~+RFID (SPONGE) ×1 IMPLANT
STAPLER CANNULA SEAL DVNC XI (STAPLE) ×1 IMPLANT
STAPLER CANNULA SEAL XI (STAPLE) ×1
SUT MNCRL 4-0 (SUTURE) ×2
SUT MNCRL 4-0 27XMFL (SUTURE) ×1
SUT VICRYL 0 AB UR-6 (SUTURE) ×2 IMPLANT
SUTURE MNCRL 4-0 27XMF (SUTURE) ×1 IMPLANT
SYS BAG RETRIEVAL 10MM (BASKET) ×2
SYSTEM BAG RETRIEVAL 10MM (BASKET) ×1 IMPLANT
WATER STERILE IRR 500ML POUR (IV SOLUTION) ×1 IMPLANT

## 2021-09-21 NOTE — Interval H&P Note (Signed)
History and Physical Interval Note:  09/21/2021 10:53 AM  Regina Baker  has presented today for surgery, with the diagnosis of k80.20 cholelithiasis w/o cholecystitis.  The various methods of treatment have been discussed with the patient and family. After consideration of risks, benefits and other options for treatment, the patient has consented to  Procedure(s): XI ROBOTIC ASSISTED LAPAROSCOPIC CHOLECYSTECTOMY (N/A) as a surgical intervention.  The patient's history has been reviewed, patient examined, no change in status, stable for surgery.  I have reviewed the patient's chart and labs.  Questions were answered to the patient's satisfaction.     Carolan Shiver

## 2021-09-21 NOTE — Op Note (Signed)
Preoperative diagnosis: Cholelithiasis  Postoperative diagnosis: Same  Procedure: Robotic Assisted Laparoscopic Cholecystectomy.   Anesthesia: GETA   Surgeon: Dr. Hazle Quant  Wound Classification: Clean Contaminated  Indications: Patient is a 26 y.o. female developed right upper quadrant pain and on workup was found to have cholelithiasis with a normal common duct. Robotic Assisted Laparoscopic cholecystectomy was elected.  Findings:  Critical view of safety achieved Cystic duct and artery identified, ligated and divided Adequate hemostasis     Description of procedure: The patient was placed on the operating table in the supine position. General anesthesia was induced. A time-out was completed verifying correct patient, procedure, site, positioning, and implant(s) and/or special equipment prior to beginning this procedure. An orogastric tube was placed. The abdomen was prepped and draped in the usual sterile fashion.  An incision was made in a natural skin line below the umbilicus.  The fascia was elevated and the Veress needle inserted. Proper position was confirmed by aspiration and saline meniscus test.  The abdomen was insufflated with carbon dioxide to a pressure of 15 mmHg. The patient tolerated insufflation well. A 8-mm trocar was then inserted in optiview fashion.  The laparoscope was inserted and the abdomen inspected. No injuries from initial trocar placement were noted. Additional trocars were then inserted in the following locations: an 8-mm trocar in the left lateral abdomen, and another two 8-mm trocars to the right side of the abdomen 5 cm appart. The umbilical trocar was changed to a 12 mm trocar all under direct visualization. The abdomen was inspected and no abnormalities were found. The table was placed in the reverse Trendelenburg position with the right side up. The robotic arms were docked and target anatomy identified. Instrument inserted under direct  visualization.  Filmy adhesions between the gallbladder and omentum, duodenum and transverse colon were lysed with electrocautery. The dome of the gallbladder was grasped with a prograsp and retracted over the dome of the liver. The infundibulum was also grasped with an atraumatic grasper and retracted toward the right lower quadrant. This maneuver exposed Calot's triangle. The peritoneum overlying the gallbladder infundibulum was then incised and the cystic duct and cystic artery identified and circumferentially dissected. Critical view of safety reviewed before ligating any structure. Firefly images taken to visualize biliary ducts. The cystic duct and cystic artery were then doubly clipped and divided close to the gallbladder.  The gallbladder was then dissected from its peritoneal attachments by electrocautery. Hemostasis was checked and the gallbladder and contained stones were removed using an endoscopic retrieval bag. The gallbladder was passed off the table as a specimen.  There was no evidence of bleeding from the gallbladder fossa or cystic artery or leakage of the bile from the cystic duct stump. Secondary trocars were removed under direct vision. No bleeding was noted. The robotic arms were undoked. The scope was withdrawn and the umbilical trocar removed. The abdomen was allowed to collapse. The fascia of the 50mm trocar sites was closed with figure-of-eight 0 vicryl sutures. The skin was closed with subcuticular sutures of 4-0 monocryl and topical skin adhesive. The orogastric tube was removed.  The patient tolerated the procedure well and was taken to the postanesthesia care unit in stable condition.   Specimen: Gallbladder  Complications: None  EBL: 5 mL

## 2021-09-21 NOTE — Anesthesia Preprocedure Evaluation (Signed)
Anesthesia Evaluation  Patient identified by MRN, date of birth, ID band Patient awake    Reviewed: Allergy & Precautions, H&P , NPO status , Patient's Chart, lab work & pertinent test results, reviewed documented beta blocker date and time   History of Anesthesia Complications Negative for: history of anesthetic complications  Airway Mallampati: III  TM Distance: >3 FB Neck ROM: full    Dental  (+) Teeth Intact, Dental Advidsory Given   Pulmonary neg pulmonary ROS, Patient abstained from smoking.,           Cardiovascular Exercise Tolerance: Good (-) anginanegative cardio ROS  (-) dysrhythmias (-) Valvular Problems/Murmurs     Neuro/Psych negative neurological ROS  negative psych ROS   GI/Hepatic Neg liver ROS, GERD  ,  Endo/Other  negative endocrine ROS  Renal/GU negative Renal ROS  negative genitourinary   Musculoskeletal   Abdominal   Peds  Hematology negative hematology ROS (+)   Anesthesia Other Findings Past Medical History: No date: Constipation   Reproductive/Obstetrics negative OB ROS                             Anesthesia Physical  Anesthesia Plan  ASA: 2  Anesthesia Plan: General   Post-op Pain Management:    Induction: Intravenous  PONV Risk Score and Plan: 3 and Ondansetron, Dexamethasone, Midazolam and Treatment may vary due to age or medical condition  Airway Management Planned: Oral ETT  Additional Equipment:   Intra-op Plan:   Post-operative Plan: Extubation in OR  Informed Consent: I have reviewed the patients History and Physical, chart, labs and discussed the procedure including the risks, benefits and alternatives for the proposed anesthesia with the patient or authorized representative who has indicated his/her understanding and acceptance.     Dental Advisory Given  Plan Discussed with: Anesthesiologist, CRNA and Surgeon  Anesthesia Plan  Comments:         Anesthesia Quick Evaluation

## 2021-09-21 NOTE — Transfer of Care (Signed)
Immediate Anesthesia Transfer of Care Note  Patient: Regina Baker  Procedure(s) Performed: XI ROBOTIC ASSISTED LAPAROSCOPIC CHOLECYSTECTOMY (Abdomen)  Patient Location: PACU  Anesthesia Type:General  Level of Consciousness: awake  Airway & Oxygen Therapy: Patient Spontanous Breathing and Patient connected to face mask oxygen  Post-op Assessment: Report given to RN and Post -op Vital signs reviewed and stable  Post vital signs: Reviewed and stable  Last Vitals:  Vitals Value Taken Time  BP 119/73 09/21/21 1324  Temp    Pulse 71 09/21/21 1326  Resp 23 09/21/21 1326  SpO2 98 % 09/21/21 1326  Vitals shown include unvalidated device data.  Last Pain:  Vitals:   09/21/21 1039  TempSrc: Temporal  PainSc: 0-No pain         Complications: No notable events documented.

## 2021-09-21 NOTE — Anesthesia Procedure Notes (Signed)
Procedure Name: Intubation Date/Time: 09/21/2021 12:15 PM  Performed by: Malva Cogan, CRNAPre-anesthesia Checklist: Patient identified, Patient being monitored, Timeout performed, Emergency Drugs available and Suction available Patient Re-evaluated:Patient Re-evaluated prior to induction Oxygen Delivery Method: Circle system utilized Preoxygenation: Pre-oxygenation with 100% oxygen Induction Type: IV induction Ventilation: Mask ventilation without difficulty Laryngoscope Size: 3 and McGraph Grade View: Grade I Tube type: Oral Tube size: 6.5 mm Number of attempts: 1 Airway Equipment and Method: Stylet and Video-laryngoscopy Placement Confirmation: ETT inserted through vocal cords under direct vision, positive ETCO2 and breath sounds checked- equal and bilateral Secured at: 20 cm Tube secured with: Tape Dental Injury: Teeth and Oropharynx as per pre-operative assessment

## 2021-09-21 NOTE — Discharge Instructions (Addendum)
  Diet: Resume home heart healthy regular diet.   Activity: No heavy lifting >20 pounds (children, pets, laundry, garbage) or strenuous activity until follow-up, but light activity and walking are encouraged. Do not drive or drink alcohol if taking narcotic pain medications.  Wound care: May shower with soapy water and pat dry (do not rub incisions), but no baths or submerging incision underwater until follow-up. (no swimming)   Medications: Resume all home medications. For mild to moderate pain: acetaminophen (Tylenol) or ibuprofen (if no kidney disease). Combining Tylenol with alcohol can substantially increase your risk of causing liver disease. Narcotic pain medications, if prescribed, can be used for severe pain, though may cause nausea, constipation, and drowsiness. Do not combine Tylenol and Percocet within a 6 hour period as Percocet contains Tylenol. If you do not need the narcotic pain medication, you do not need to fill the prescription.   TYLENOL 1,000 MG GIVEN AT THE HOSPITAL AT 12:51 PM.  NON-STERROIDAL ANTIINFLAMMATORY MEDICATION TORADOL GIVEN AT  HOSPITAL AT 1:05 PM.  Call office [phone]) at any time if any questions, worsening pain, fevers/chills, bleeding, drainage from incision site, or other concerns.    AMBULATORY SURGERY  DISCHARGE INSTRUCTIONS   The drugs that you were given will stay in your system until tomorrow so for the next 24 hours you should not:  Drive an automobile Make any legal decisions Drink any alcoholic beverage   You may resume regular meals tomorrow.  Today it is better to start with liquids and gradually work up to solid foods.  You may eat anything you prefer, but it is better to start with liquids, then soup and crackers, and gradually work up to solid foods.   Please notify your doctor immediately if you have any unusual bleeding, trouble breathing, redness and pain at the surgery site, drainage, fever, or pain not relieved by  medication.    Additional Instructions:        Please contact your physician with any problems or Same Day Surgery at [phone], Monday through Friday 6 am to 4 pm, or [place] at [facility] number at [phone].

## 2021-09-22 LAB — SURGICAL PATHOLOGY

## 2021-09-22 NOTE — Anesthesia Postprocedure Evaluation (Signed)
Anesthesia Post Note  Patient: Regina Baker  Procedure(s) Performed: XI ROBOTIC ASSISTED LAPAROSCOPIC CHOLECYSTECTOMY (Abdomen)  Patient location during evaluation: PACU Anesthesia Type: General Level of consciousness: awake and alert Pain management: pain level controlled Vital Signs Assessment: post-procedure vital signs reviewed and stable Respiratory status: spontaneous breathing, nonlabored ventilation, respiratory function stable and patient connected to nasal cannula oxygen Cardiovascular status: blood pressure returned to baseline and stable Postop Assessment: no apparent nausea or vomiting Anesthetic complications: no   No notable events documented.   Last Vitals:  Vitals:   09/21/21 1415 09/21/21 1431  BP: 107/71 114/66  Pulse: 72 73  Resp: 14 18  Temp: 36.5 C 36.7 C  SpO2: 97% 100%    Last Pain:  Vitals:   09/21/21 1431  TempSrc: Temporal  PainSc: 3                  Lenard Simmer

## 2021-12-30 ENCOUNTER — Ambulatory Visit
Admission: EM | Admit: 2021-12-30 | Discharge: 2021-12-30 | Disposition: A | Discharge status: Home / Self Care | Payer: Medicaid Other | Attending: Internal Medicine | Admitting: Internal Medicine

## 2021-12-30 DIAGNOSIS — H5789 Other specified disorders of eye and adnexa: Secondary | ICD-10-CM | POA: Diagnosis not present

## 2021-12-30 DIAGNOSIS — R030 Elevated blood-pressure reading, without diagnosis of hypertension: Secondary | ICD-10-CM

## 2021-12-30 DIAGNOSIS — G4489 Other headache syndrome: Secondary | ICD-10-CM | POA: Diagnosis not present

## 2021-12-30 DIAGNOSIS — H53143 Visual discomfort, bilateral: Secondary | ICD-10-CM

## 2021-12-30 LAB — GLUCOSE, CAPILLARY: Glucose-Capillary: 114 mg/dL — ABNORMAL HIGH (ref 70–99)

## 2021-12-30 NOTE — Discharge Instructions (Addendum)
Your glucose is 114 Please go see the eye doctor today   eye center [address] [place], [place] [address] Hours:  Wednesday Closed Thursday Closed Friday (Veterans Day (Observed)) 8?AM-12?PM, 1-5?PM Hours might differ Saturday [facility]) Closed Sunday Closed Monday Closed Tuesday 8?AM-12?PM, 1-5?PM Suggest new hours  Phone: [phone]

## 2021-12-30 NOTE — ED Triage Notes (Signed)
Patient reports that she has a "bump" on both or her top eyelids -- noticed it about 3 weeks ago. This has been causing her headaches.   Patient has used OTC headache medication with no help.

## 2021-12-30 NOTE — ED Provider Notes (Addendum)
[facility]    CSN: [id number] Arrival date & time: 12/30/21  0953      History   Chief Complaint Chief Complaint  Patient presents with   Eye Problem   Headache    HPI Regina Baker is a 26 y.o. female who presents with lump on upper medial L orbit under her lid x 3 weeks and this week the R one started. This are painful and provokes her to have forehead HA's that over the counter medications are not helping. Denies hx of HA's or elevated BP.     Past Medical History:  Diagnosis Date   Constipation     Patient Active Problem List   Diagnosis Date Noted   Labor and delivery indication for care or intervention 10/24/2017   Pregnancy 10/04/2017   Abdominal pain in pregnancy, third trimester 08/19/2017   First trimester screening     Past Surgical History:  Procedure Laterality Date   KNEE SURGERY Right     OB History     Gravida  1   Para  1   Term  1   Preterm      AB      Living  1      SAB      IAB      Ectopic      Multiple  0   Live Births  1            Home Medications    Prior to Admission medications   Medication Sig Start Date End Date Taking? Authorizing Provider  oxyCODONE-acetaminophen (PERCOCET) 5-325 MG tablet Take 1 tablet by mouth every 4 (four) hours as needed for severe pain. 09/21/21 09/21/22  Carolan Shiver, MD    Family History Family History  Problem Relation Age of Onset   Healthy Mother    Healthy Father     Social History Social History   Tobacco Use   Smoking status: Never   Smokeless tobacco: Never  Vaping Use   Vaping Use: Every day   Substances: Nicotine, Flavoring  Substance Use Topics   Alcohol use: No   Drug use: No     Allergies   Penicillins   Review of Systems Review of Systems  Constitutional:  Negative for fever.       Had sweating for no reason while at work last week   HENT:  Negative for congestion, postnasal drip, rhinorrhea, sinus pressure and sinus  pain.   Eyes:  Positive for photophobia. Negative for pain, discharge, redness, itching and visual disturbance.  Respiratory:  Negative for cough.   Endocrine:       Has had slight elevation of glucose this Summer   Skin:  Negative for rash.  Neurological:  Positive for headaches. Negative for dizziness.     Physical Exam Triage Vital Signs ED Triage Vitals  Enc Vitals Group     BP 12/30/21 1032 (!) 135/92     Pulse Rate 12/30/21 1032 97     Resp --      Temp 12/30/21 1032 98 F (36.7 C)     Temp Source 12/30/21 1032 Oral     SpO2 12/30/21 1032 97 %     Weight 12/30/21 1031 227 lb (103 kg)     Height 12/30/21 1031 5\' 7"  (1.702 m)     Head Circumference --      Peak Flow --      Pain Score 12/30/21 1030 6     Pain Loc --  Pain Edu? --      Excl. in GC? --    No data found.  Updated Vital Signs BP (!) 135/92 (BP Location: Left Arm)   Pulse 97   Temp 98 F (36.7 C) (Oral)   Ht 5\' 7"  (1.702 m)   Wt 227 lb (103 kg)   SpO2 97%   BMI 35.55 kg/m   Visual Acuity Right Eye Distance: 20/25 (Uncorrected) Left Eye Distance: 20/50 (Uncorrected) Bilateral Distance: 20/25 (Uncorrected)  Right Eye Near:   Left Eye Near:    Bilateral Near:     Physical Exam Vitals and nursing note reviewed.  Constitutional:      General: She is not in acute distress.    Appearance: She is obese. She is not toxic-appearing.  HENT:     Head:     Comments: Sinuses are not tender    Right Ear: External ear normal.     Left Ear: External ear normal.  Eyes:     Pupils: Pupils are equal, round, and reactive to light.      Comments: Has very soft mass on upper inner orbits L>R  Pulmonary:     Effort: Pulmonary effort is normal.  Musculoskeletal:        General: Normal range of motion.     Cervical back: Neck supple.  Skin:    General: Skin is warm and dry.  Neurological:     Mental Status: She is alert and oriented to person, place, and time.     Gait: Gait normal.  Psychiatric:         Mood and Affect: Mood normal.        Behavior: Behavior normal.        Thought Content: Thought content normal.        Judgment: Judgment normal.      UC Treatments / Results  Labs (all labs ordered are listed, but only abnormal results are displayed) Labs Reviewed  GLUCOSE, CAPILLARY - Abnormal; Notable for the following components:      Result Value   Glucose-Capillary 114 (*)    All other components within normal limits  CBG MONITORING, ED    EKG   Radiology No results found.  Procedures Procedures (including critical care time)  Medications Ordered in UC Medications - No data to display  Initial Impression / Assessment and Plan / UC Course  I have reviewed the triage vital signs and the nursing notes.  Pertinent labs  results that were available during my care of the patient were reviewed by me and considered in my medical decision making (see chart for details).  Bilateral orbit masses Photophobia HA Hx of hyperglycemia  She was sent to the eye doctor today, I am concerned of the masses and possible intraocular pressure.   Final Clinical Impressions(s) / UC Diagnoses  Bilateral orbit masses Photophobia HA Hx of hyperglycemia Elevated BP  Final diagnoses:  Other headache syndrome  Photophobia of both eyes  Swelling or mass of both eyes  Elevated blood pressure reading     Discharge Instructions      Your glucose is 114 Please go see the eye doctor today  [facility] [address] [place], [place] Hours:  Wednesday Closed Thursday Closed Friday (Veterans Day (Observed)) 8?AM-12?PM, 1-5?PM Hours might differ Saturday [facility]) Closed Sunday Closed Monday Closed Tuesday 8?AM-12?PM, 1-5?PM Suggest new hours  Phone: [phone]    ED Prescriptions   None    PDMP not  reviewed this encounter.   Garey Ham, PA-C 12/30/21 1131    Rodriguez-Southworth, [place], PA-C 12/30/21  1134    Rodriguez-Southworth, [place], [place] 12/30/21 1134

## 2024-01-18 ENCOUNTER — Other Ambulatory Visit: Payer: Self-pay

## 2024-01-18 ENCOUNTER — Observation Stay
Admission: EM | Admit: 2024-01-18 | Discharge: 2024-01-18 | Disposition: A | Discharge status: Home / Self Care | Source: Ambulatory Visit | Attending: Obstetrics and Gynecology | Admitting: Obstetrics and Gynecology

## 2024-01-18 DIAGNOSIS — Z3A24 24 weeks gestation of pregnancy: Secondary | ICD-10-CM | POA: Insufficient documentation

## 2024-01-18 DIAGNOSIS — O99212 Obesity complicating pregnancy, second trimester: Secondary | ICD-10-CM | POA: Insufficient documentation

## 2024-01-18 DIAGNOSIS — O98512 Other viral diseases complicating pregnancy, second trimester: Secondary | ICD-10-CM | POA: Diagnosis not present

## 2024-01-18 DIAGNOSIS — O10012 Pre-existing essential hypertension complicating pregnancy, second trimester: Principal | ICD-10-CM | POA: Insufficient documentation

## 2024-01-18 DIAGNOSIS — O99613 Diseases of the digestive system complicating pregnancy, third trimester: Secondary | ICD-10-CM | POA: Insufficient documentation

## 2024-01-18 DIAGNOSIS — O99342 Other mental disorders complicating pregnancy, second trimester: Secondary | ICD-10-CM | POA: Diagnosis not present

## 2024-01-18 DIAGNOSIS — K5900 Constipation, unspecified: Secondary | ICD-10-CM | POA: Diagnosis not present

## 2024-01-18 DIAGNOSIS — O10912 Unspecified pre-existing hypertension complicating pregnancy, second trimester: Principal | ICD-10-CM | POA: Insufficient documentation

## 2024-01-18 DIAGNOSIS — R682 Dry mouth, unspecified: Secondary | ICD-10-CM | POA: Diagnosis not present

## 2024-01-18 DIAGNOSIS — Z8759 Personal history of other complications of pregnancy, childbirth and the puerperium: Secondary | ICD-10-CM | POA: Insufficient documentation

## 2024-01-18 DIAGNOSIS — E66812 Obesity, class 2: Secondary | ICD-10-CM | POA: Diagnosis not present

## 2024-01-18 DIAGNOSIS — F329 Major depressive disorder, single episode, unspecified: Secondary | ICD-10-CM | POA: Insufficient documentation

## 2024-01-18 DIAGNOSIS — O163 Unspecified maternal hypertension, third trimester: Secondary | ICD-10-CM | POA: Diagnosis present

## 2024-01-18 LAB — COMPREHENSIVE METABOLIC PANEL WITH GFR
ALT: 9 U/L (ref 0–44)
AST: 13 U/L — ABNORMAL LOW (ref 15–41)
Albumin: 3.3 g/dL — ABNORMAL LOW (ref 3.5–5.0)
Alkaline Phosphatase: 75 U/L (ref 38–126)
Anion gap: 10 (ref 5–15)
BUN: 6 mg/dL (ref 6–20)
CO2: 23 mmol/L (ref 22–32)
Calcium: 9.2 mg/dL (ref 8.9–10.3)
Chloride: 106 mmol/L (ref 98–111)
Creatinine, Ser: 0.59 mg/dL (ref 0.44–1.00)
GFR, Estimated: >60 mL/min (ref >60)
Glucose, Bld: 99 mg/dL (ref 70–99)
Potassium: 4.2 mmol/L (ref 3.5–5.1)
Sodium: 138 mmol/L (ref 135–145)
Total Bilirubin: <0.2 mg/dL (ref 0.0–1.2)
Total Protein: 6.3 g/dL — ABNORMAL LOW (ref 6.5–8.1)

## 2024-01-18 LAB — CBC
HCT: 34.5 % — ABNORMAL LOW (ref 36.0–46.0)
Hemoglobin: 11.6 g/dL — ABNORMAL LOW (ref 12.0–15.0)
MCH: 29.5 pg (ref 26.0–34.0)
MCHC: 33.6 g/dL (ref 30.0–36.0)
MCV: 87.8 fL (ref 80.0–100.0)
Platelets: 237 K/uL (ref 150–400)
RBC: 3.93 MIL/uL (ref 3.87–5.11)
RDW: 14.9 % (ref 11.5–15.5)
WBC: 13.6 K/uL — ABNORMAL HIGH (ref 4.0–10.5)
nRBC: 0 % (ref 0.0–0.2)

## 2024-01-18 LAB — PROTEIN / CREATININE RATIO, URINE
Creatinine, Urine: 253 mg/dL
Protein Creatinine Ratio: 0.08 mg/mg{creat} (ref 0.00–0.15)
Total Protein, Urine: 20 mg/dL

## 2024-01-18 NOTE — OB Triage Note (Signed)
 Patient sent to triage from office visit today for evaluation of elevated blood pressures. Patient reports good fetal movement,denies leaking of fluid, vaginal bleeding or contractions. Denies headaches or visual changes as well as epigastric pain. Patient has not noticed any recent swelling. Initial BP 122/72 taken with large cuff on left arm. FHT's obtained reading 140. Difficult to continuously trace due to fetal size/movement/ position- uncomfortable for patient due to low lying in pelvis. Toco continuous. Will notify cnm of patient's arrival and difficulty continuously monitoring EFM.

## 2024-01-18 NOTE — Discharge Instructions (Signed)
 PRETERM LABOR: Includes any of the following symptoms that occur between 20-[redacted] weeks gestation. If these symptoms are not stopped, preterm labor can result in preterm delivery, placing your baby at risk.  Notify your doctor if any of the following occur: 1. Menstrual-like cramps   5. Pelvic pressure  2. Uterine contractions. These may be painless and feel like the uterus is tightening or the baby is balling up 6. Increase or change in vaginal discharge  3. Low, dull backache, unrelieved by heat or Tylenol   7. Vaginal bleeding  4. Intestinal cramps, with our without diarrhea, sometimes 8. A general feeling that something is not right   9. Leaking of fluid described as gas pain

## 2024-01-18 NOTE — Discharge Summary (Signed)
 Patient ID: Regina Baker MRN: [id number] DOB/AGE: December 08, 1995 28 y.o.  Admit date: 01/18/2024 Discharge date: 01/18/2024  Admission Diagnoses: 28 yo G2P1 at [redacted]w[redacted]d with elevated BPs recently at home and in the office today.  In the office her BPs were 126/90 -> 134/86. She reports home BPs have been 130s-140s / 90s.    Discharge Diagnoses: Chronic hypertension  Factors complicating pregnancy: Chronic Hypertension Obesity BMI > 35 and < 40:  History Of Gestational Hypertension with G1 pregnancy H/o mental health diagnoses: Depression Chronic Constipation Ptyalism Varicella Non Immune  Prenatal Procedures: Labs  Consults: None  Significant Diagnostic Studies:  Results for orders placed or performed during the hospital encounter of 01/18/24 (from the past week)  Protein / creatinine ratio, urine   Collection Time: 01/18/24 12:05 PM  Result Value Ref Range   Creatinine, Urine 253 mg/dL   Total Protein, Urine 20 mg/dL   Protein Creatinine Ratio 0.08 0.00 - 0.15 mg/mg[Cre]  CBC   Collection Time: 01/18/24 12:42 PM  Result Value Ref Range   WBC 13.6 (H) 4.0 - 10.5 K/uL   RBC 3.93 3.87 - 5.11 MIL/uL   Hemoglobin 11.6 (L) 12.0 - 15.0 g/dL   HCT 65.4 (L) 63.9 - 53.9 %   MCV 87.8 80.0 - 100.0 fL   MCH 29.5 26.0 - 34.0 pg   MCHC 33.6 30.0 - 36.0 g/dL   RDW 85.0 88.4 - 84.4 %   Platelets 237 150 - 400 K/uL   nRBC 0.0 0.0 - 0.2 %  Comprehensive metabolic panel   Collection Time: 01/18/24 12:42 PM  Result Value Ref Range   Sodium 138 135 - 145 mmol/L   Potassium 4.2 3.5 - 5.1 mmol/L   Chloride 106 98 - 111 mmol/L   CO2 23 22 - 32 mmol/L   Glucose, Bld 99 70 - 99 mg/dL   BUN 6 6 - 20 mg/dL   Creatinine, Ser 9.40 0.44 - 1.00 mg/dL   Calcium 9.2 8.9 - 89.6 mg/dL   Total Protein 6.3 (L) 6.5 - 8.1 g/dL   Albumin 3.3 (L) 3.5 - 5.0 g/dL   AST 13 (L) 15 - 41 U/L   ALT 9 0 - 44 U/L   Alkaline Phosphatase 75 38 - 126 U/L   Total Bilirubin <0.2 0.0 - 1.2 mg/dL   GFR, Estimated >39  >39 mL/min   Anion gap 10 5 - 15    Treatments: none  Hospital Course:  This is a 28 y.o. G2P1001 with IUP at [redacted]w[redacted]d admitted for elevated BPs and PIH work up.  She denies HA, right epigastric pain, or changes in vision.  BPs and labs WNL. She was observed, fetal heart rate monitoring remained reassuring, and she had no signs/symptoms of progressing preterm labor or other maternal-fetal concerns.  She was deemed stable for discharge to home with outpatient follow up.  Discharge Physical Exam:  BP 118/74   Pulse (!) 101   Temp 97.7 F (36.5 C) (Oral)   Resp 18   SpO2 98%  Vitals:   01/18/24 1153 01/18/24 1208 01/18/24 1225 01/18/24 1240  BP: 122/72 117/70 109/61 118/64   01/18/24 1255 01/18/24 1310  BP: 113/62 118/74         General: NAD CV: RRR Pulm: nl effort ABD: s/nd/nt, gravid DVT Evaluation: LE non-ttp, no evidence of DVT on exam.  NST: Doppler: 144  TOCO: quiet SVE: deferred      Discharge Condition: Stable  Disposition: Discharge disposition: 01-Home or Self Care  Allergies as of 01/18/2024       Reactions   Penicillins Rash        Medication List     TAKE these medications    prenatal multivitamin Tabs tablet Take 1 tablet by mouth daily at 12 noon.         SignedBETHA DELON COE, CNM 01/18/2024 3:10 PM

## 2024-01-29 ENCOUNTER — Encounter: Payer: Self-pay | Admitting: Obstetrics and Gynecology

## 2024-01-29 ENCOUNTER — Observation Stay
Admission: EM | Admit: 2024-01-29 | Discharge: 2024-01-29 | Disposition: A | Discharge status: Home / Self Care | Attending: Certified Nurse Midwife | Admitting: Obstetrics and Gynecology

## 2024-01-29 DIAGNOSIS — O99212 Obesity complicating pregnancy, second trimester: Secondary | ICD-10-CM | POA: Insufficient documentation

## 2024-01-29 DIAGNOSIS — F32A Depression, unspecified: Secondary | ICD-10-CM | POA: Insufficient documentation

## 2024-01-29 DIAGNOSIS — K5900 Constipation, unspecified: Secondary | ICD-10-CM | POA: Diagnosis present

## 2024-01-29 DIAGNOSIS — Z3A26 26 weeks gestation of pregnancy: Secondary | ICD-10-CM | POA: Insufficient documentation

## 2024-01-29 DIAGNOSIS — O10012 Pre-existing essential hypertension complicating pregnancy, second trimester: Secondary | ICD-10-CM | POA: Insufficient documentation

## 2024-01-29 DIAGNOSIS — O99810 Abnormal glucose complicating pregnancy: Principal | ICD-10-CM | POA: Insufficient documentation

## 2024-01-29 DIAGNOSIS — O10912 Unspecified pre-existing hypertension complicating pregnancy, second trimester: Principal | ICD-10-CM

## 2024-01-29 DIAGNOSIS — O99342 Other mental disorders complicating pregnancy, second trimester: Secondary | ICD-10-CM | POA: Insufficient documentation

## 2024-01-29 DIAGNOSIS — E6609 Other obesity due to excess calories: Secondary | ICD-10-CM | POA: Insufficient documentation

## 2024-01-29 MED ORDER — FLEET ENEMA RE ENEM
1.0000 | ENEMA | Freq: Once | RECTAL | Status: AC
  Administered 2024-01-29: 1 via RECTAL

## 2024-01-29 MED ORDER — DOCUSATE SODIUM 100 MG PO CAPS
100.0000 mg | ORAL_CAPSULE | Freq: Once | ORAL | Status: AC
  Administered 2024-01-29: 100 mg via ORAL
  Filled 2024-01-29: qty 1

## 2024-01-29 MED ORDER — ACETAMINOPHEN 325 MG PO TABS: 650.0000 mg | ORAL_TABLET | ORAL | Status: DC | PRN

## 2024-01-29 MED ORDER — CALCIUM CARBONATE ANTACID 500 MG PO CHEW: 2.0000 | CHEWABLE_TABLET | ORAL | Status: DC | PRN

## 2024-01-29 MED ORDER — POLYETHYLENE GLYCOL 3350 17 G PO PACK
17.0000 g | PACK | Freq: Once | ORAL | Status: AC
  Administered 2024-01-29: 17 g via ORAL
  Filled 2024-01-29: qty 1

## 2024-01-29 MED ORDER — POLYETHYLENE GLYCOL 3350 17 G PO PACK: 17.0000 g | PACK | Freq: Every day | ORAL | Status: DC

## 2024-01-29 NOTE — Discharge Summary (Signed)
 Regina Baker is a 28 y.o. female. She is at [redacted]w[redacted]d gestation. No LMP recorded. Patient is pregnant. Estimated Date of Delivery: 05/04/24  Prenatal care site: [facility]  Current pregnancy complicated by:  Chronic Hypertension Obesity BMI > 35 and < 40:  History Of Gestational Hypertension with G1 pregnancy H/o mental health diagnoses: Depression Chronic Constipation Ptyalism Varicella Non Immune  Chief complaint: constipation  She reports constipation since Thursday and is now having painful abdominal cramping from the constipation. She was able to have a small hard bowel movement but knows she is still constipated and the feeling of constipation is worsening. She tried one 100mg  Colace without any relief. She is requesting medication to help her have a bowel movement and then to be discharged to have the bowel movement at home.  S: Resting comfortably. no CTX, no VB.no LOF,  Active fetal movement.  Denies: HA, visual changes, SOB, or RUQ/epigastric pain  Maternal Medical History:   Past Medical History:  Diagnosis Date   Constipation     Past Surgical History:  Procedure Laterality Date   CHOLECYSTECTOMY     about two years ago   KNEE SURGERY Right     Allergies  Allergen Reactions   Penicillins Rash    Prior to Admission medications   Medication Sig Start Date End Date Taking? Authorizing Provider  aspirin EC 81 MG tablet Take 81 mg by mouth daily. Swallow whole.   Yes [provider]  Prenatal Vit-Fe Fumarate-FA (PRENATAL MULTIVITAMIN) TABS tablet Take 1 tablet by mouth daily at 12 noon.   Yes [provider]    Social History: She  reports that she has never smoked. She has never used smokeless tobacco. She reports that she does not drink alcohol and does not use drugs.  Family History: family history includes Healthy in her father and mother.  no history of gyn cancers  Review of Systems: A full review of systems was performed and  negative except as noted in the HPI.     O:  BP 125/68   Pulse (!) 112   Temp 97.6 F (36.4 C) (Oral)  No results found for this or any previous visit (from the past 48 hours).   Constitutional: NAD, AAOx3  HE/ENT: extraocular movements grossly intact, moist mucous membranes CV: RRR PULM: nl respiratory effort, CTABL     Abd: gravid, non-tender, non-distended, soft      Ext: Non-tender, Nonedematous   Psych: mood appropriate, speech normal Pelvic: deferred  Fetal  monitoring: Cat 1 Appropriate for GA Baseline: 145bpm Variability: moderate Accelerations: 10x10 Decelerations absent Time  A/P: 28 y.o. [redacted]w[redacted]d here for antenatal surveillance for constipation  Principle Diagnosis:  Constipation  Labor: not present.  Fetal Wellbeing: Reassuring Cat 1 tracing. Constipation: administered Colace 100mg , Miralax  17g, and she is sent home with a Fleet enema if the Colace and Miralax  do not work. If she is unsuccessful in having a bowel movement, she is advised to return for further evaluation. She is agreeable with this plan.  D/c home stable, precautions reviewed, follow-up as scheduled.    Edsel Charlies Blush, CNM 01/29/2024 11:20 AM

## 2024-01-29 NOTE — OB Triage Note (Signed)
 Pt Regina Baker 28 y.o. presents to labor and delivery triage reporting  constipation since Thursday, overnight the cramping has increased and patient was unsure if she was contracting. Patient took stool softener at home with no relief. . Pt is a G2P1001 at [redacted]w[redacted]d . Pt denies signs and symptons consistent with rupture of membranes or active vaginal bleeding. Pt denies contractions and states positive fetal movement. External FM and TOCO applied to non-tender abdomen and assessing. Initial FHR 135 . Vital signs obtained and within normal limits. Provider notified of pt.

## 2024-01-29 NOTE — OB Triage Note (Signed)
 Patient discharged home in stable condition by provider. Discharge instructions given. Patient verbalized understanding, no questions or concerns. Pt discharged in personal vehicle with support person. Labor precautions given.  Jay Haskew L. Macen Joslin, RN BSN 01/29/2024 3:26 AM

## 2024-01-30 NOTE — Progress Notes (Signed)
 Obstetrics & Gynecology Office Visit   Patient's last menstrual period was 07/29/2023 (approximate). Estimated Date of Delivery: 05/04/24  Patient concerns today: here for follow up growth ultrasound. She also reports of constipation, she was seen in the ED yesterday for cramping and lower back pain, unsure if they were contractions. Reassuring monitoring on L&D. Last bowel movement Thursday 12/04, but was straining and not like a normal bowel movement. In L&D, she was given Miralax  and colace but had an emesis episode and did receive an enema. She has also tried prune juice with no improvement. Feels that when she is using the bathroom she feels there is a large stool burden but unable to pass it.   ULTRASOUND REPORT  Location: [facility] Date of Service: 01/30/2024    Indications:growth/afi for chronic HTN Findings:  Regina Baker intrauterine pregnancy is visualized with FHR at 144 BPM.   Biometrics give an (U/S) Gestational age of [redacted]w[redacted]d and an (U/S) EDD of 05/04/2024; this correlates with the clinically established Estimated Date of Delivery: 05/04/24.   Fetal presentation is Cephalic.  Placenta: fundal, right lateral.  AFI: 15.3 cm  Growth percentile is 42%.  AC percentile is 39%. EFW: 947 g, 2 lbs 1 oz  Impression: 1. [redacted]w[redacted]d Viable Singleton Intrauterine pregnancy previously established criteria. 2. Growth is 42 %ile.  AFI is normal - 15.3 cm.    Pt denies contractions, vaginal bleeding, leaking fluid. Endorses good fetal movement Pt denies HA, VD or RUQ pain.   BP 122/86   Pulse (!) 123   Ht 170.2 cm (5' 7)   Wt (!) 112 kg (247 lb)   LMP 07/29/2023 (Approximate)   BMI 38.69 kg/m   TWG: 2.268 kg (5 lb)  Gen: NAD  Pulm: No use of accessory muscles, normal respirations Abdomen: Gravid, nontender Ext : No edema, no rashes.   Psych: Mood, insight, judgement intact SVE: deferred   28 y.o. G2P1001 AT [redacted]w[redacted]d  The primary encounter diagnosis was Supervision of high risk  pregnancy in second trimester (HHS-HCC). Diagnoses of Obesity in pregnancy, antepartum (HHS-HCC), Chronic hypertension affecting pregnancy (HHS-HCC), Depression complicating pregnancy, antepartum (HHS-HCC), Maternal varicella, non-immune (HHS-HCC), and Constipation during pregnancy in second trimester (HHS-HCC) were also pertinent to this visit.  Problem list reviewed and/or updated  1. Supervision of high risk pregnancy in second trimester (HHS-HCC) - 1hr GTT, CBC, and HIV/RPR at next visit.  - Discussed diabetes screen in pregnancy.   - TDAP vaccine discussed, recommended at 27-36 weeks.   - She desires the TDAP vaccine at the next visit.  - Contraception options reviewed - she has not chosen an option.  - Encouraged to review handouts, most likely will not choose contraception.     2. Obesity in pregnancy, antepartum (HHS-HCC) - Care per HTN plan  3. Chronic hypertension affecting pregnancy (HHS-HCC) - Taking aspirin  - Growth u/s q4 weeks, order placed today  4. Depression complicating pregnancy, antepartum (HHS-HCC) - No current medications  5. Maternal varicella, non-immune (HHS-HCC) - Offer vaccine PP  6. Constipation during pregnancy in second trimester (HHS-HCC) - Discussed daily use of stool softener, adequate hydration, as well as use of enema, if needed. Discussed incorporating fiber into diet and warm prune juice.     FKC reviewed.  Pre-term labor precautions reviewed.  and Call with bleeding, contractions, PROM, or decreased fetal movement.   Return in about 2 weeks (around 02/13/2024) for ROB w/ 28 week labs.   Attestation Statement:   I personally performed the service, non-incident  to. [facility])   KATHERINE PHILLIPS, FNP [facility] 01/30/2024 3:58 PM

## 2024-02-03 ENCOUNTER — Encounter: Payer: Self-pay | Admitting: Certified Nurse Midwife

## 2024-02-03 DIAGNOSIS — O10919 Unspecified pre-existing hypertension complicating pregnancy, unspecified trimester: Secondary | ICD-10-CM

## 2024-03-01 ENCOUNTER — Observation Stay
Admission: EM | Admit: 2024-03-01 | Discharge: 2024-03-01 | Disposition: A | Discharge status: Home / Self Care | Attending: Certified Nurse Midwife | Admitting: Certified Nurse Midwife

## 2024-03-01 DIAGNOSIS — R109 Unspecified abdominal pain: Secondary | ICD-10-CM | POA: Diagnosis present

## 2024-03-01 DIAGNOSIS — F329 Major depressive disorder, single episode, unspecified: Secondary | ICD-10-CM | POA: Insufficient documentation

## 2024-03-01 DIAGNOSIS — E669 Obesity, unspecified: Secondary | ICD-10-CM | POA: Diagnosis not present

## 2024-03-01 DIAGNOSIS — Z3A30 30 weeks gestation of pregnancy: Secondary | ICD-10-CM | POA: Diagnosis not present

## 2024-03-01 DIAGNOSIS — K5909 Other constipation: Secondary | ICD-10-CM | POA: Diagnosis not present

## 2024-03-01 DIAGNOSIS — R21 Rash and other nonspecific skin eruption: Secondary | ICD-10-CM | POA: Diagnosis not present

## 2024-03-01 DIAGNOSIS — O10913 Unspecified pre-existing hypertension complicating pregnancy, third trimester: Secondary | ICD-10-CM | POA: Diagnosis not present

## 2024-03-01 DIAGNOSIS — B019 Varicella without complication: Secondary | ICD-10-CM | POA: Diagnosis not present

## 2024-03-01 DIAGNOSIS — O26893 Other specified pregnancy related conditions, third trimester: Principal | ICD-10-CM | POA: Insufficient documentation

## 2024-03-01 DIAGNOSIS — O99213 Obesity complicating pregnancy, third trimester: Secondary | ICD-10-CM | POA: Insufficient documentation

## 2024-03-01 DIAGNOSIS — O99713 Diseases of the skin and subcutaneous tissue complicating pregnancy, third trimester: Secondary | ICD-10-CM | POA: Diagnosis not present

## 2024-03-01 DIAGNOSIS — Z7982 Long term (current) use of aspirin: Secondary | ICD-10-CM | POA: Diagnosis not present

## 2024-03-01 DIAGNOSIS — Z8759 Personal history of other complications of pregnancy, childbirth and the puerperium: Secondary | ICD-10-CM | POA: Insufficient documentation

## 2024-03-01 DIAGNOSIS — Z6835 Body mass index (BMI) 35.0-35.9, adult: Secondary | ICD-10-CM | POA: Diagnosis not present

## 2024-03-01 LAB — WET PREP, GENITAL
Clue Cells Wet Prep HPF POC: NONE SEEN
Sperm: NONE SEEN
Trich, Wet Prep: NONE SEEN
WBC, Wet Prep HPF POC: >=10 — AB
Yeast Wet Prep HPF POC: NONE SEEN

## 2024-03-01 LAB — CBC
HCT: 34.1 % — ABNORMAL LOW (ref 36.0–46.0)
Hemoglobin: 11.4 g/dL — ABNORMAL LOW (ref 12.0–15.0)
MCH: 28.5 pg (ref 26.0–34.0)
MCHC: 33.4 g/dL (ref 30.0–36.0)
MCV: 85.3 fL (ref 80.0–100.0)
Platelets: 253 K/uL (ref 150–400)
RBC: 4 MIL/uL (ref 3.87–5.11)
RDW: 15 % (ref 11.5–15.5)
WBC: 13.5 K/uL — ABNORMAL HIGH (ref 4.0–10.5)
nRBC: 0 % (ref 0.0–0.2)

## 2024-03-01 LAB — RESP PANEL BY RT-PCR (RSV, FLU A&B, COVID)  RVPGX2
Influenza A by PCR: NEGATIVE
Influenza B by PCR: NEGATIVE
Resp Syncytial Virus by PCR: NEGATIVE
SARS Coronavirus 2 by RT PCR: NEGATIVE

## 2024-03-01 MED ORDER — LACTATED RINGERS IV SOLN: INTRAVENOUS | Status: DC

## 2024-03-01 MED ORDER — LOPERAMIDE HCL 2 MG PO TABS: 2.0000 mg | ORAL_TABLET | Freq: Three times a day (TID) | ORAL | Status: AC | PRN

## 2024-03-01 MED ORDER — DIPHENHYDRAMINE HCL 50 MG PO TABS: 25.0000 mg | ORAL_TABLET | Freq: Every evening | ORAL | Status: AC | PRN

## 2024-03-01 MED ORDER — LACTATED RINGERS IV BOLUS: 1000.0000 mL | Freq: Once | INTRAVENOUS | Status: DC

## 2024-03-01 MED ORDER — ACETAMINOPHEN 500 MG PO TABS: 500.0000 mg | ORAL_TABLET | Freq: Four times a day (QID) | ORAL | Status: AC | PRN

## 2024-03-01 NOTE — OB Triage Note (Signed)
 Pt complains of diarrhea, cold chills, and abdominal pain since 1715; rates pain 5/10. Denies any urgency, burning, or vaginal bleeding; reports positive fetal movement.

## 2024-03-01 NOTE — Discharge Summary (Signed)
 Patient ID: Regina Baker MRN: [id number] DOB/AGE: Nov 12, 1995 29 y.o.  Admit date: 03/01/2024 Discharge date: 03/01/2024  Admission Diagnoses: 29 y.o. G2P1001 at [redacted]w[redacted]d present with diarrhea, cold chills, and abdominal pain since 1715; rates pain 5/10. Denies any urgency, burning, or vaginal bleeding; reports positive fetal movement   Discharge Diagnoses:  Labor: not present.  Viral symptoms: negative resp panel, elevated WBC Wet prep neg Fetal Wellbeing: NST is Reassuring Cat 1 tracing. D/c home stable, precautions reviewed, follow-up as scheduled.   Factors complicating pregnancy: Obesity BMI > 35 and < 40:  History Of Gestational Hypertension with G1 pregnancy Chronic HTN  H/o mental health diagnoses: Depression Chronic Constipation Groin rash Ptyalism Varicella Non Immune   Prenatal Procedures: NST  Consults: None  Significant Diagnostic Studies:  Results for orders placed or performed during the hospital encounter of 03/01/24 (from the past week)  Resp panel by RT-PCR (RSV, Flu A&B, Covid) Anterior Nasal Swab   Collection Time: 03/01/24  7:41 PM   Specimen: Anterior Nasal Swab  Result Value Ref Range   SARS Coronavirus 2 by RT PCR NEGATIVE NEGATIVE   Influenza A by PCR NEGATIVE NEGATIVE   Influenza B by PCR NEGATIVE NEGATIVE   Resp Syncytial Virus by PCR NEGATIVE NEGATIVE  CBC   Collection Time: 03/01/24  7:58 PM  Result Value Ref Range   WBC 13.5 (H) 4.0 - 10.5 K/uL   RBC 4.00 3.87 - 5.11 MIL/uL   Hemoglobin 11.4 (L) 12.0 - 15.0 g/dL   HCT 65.8 (L) 63.9 - 53.9 %   MCV 85.3 80.0 - 100.0 fL   MCH 28.5 26.0 - 34.0 pg   MCHC 33.4 30.0 - 36.0 g/dL   RDW 84.9 88.4 - 84.4 %   Platelets 253 150 - 400 K/uL   nRBC 0.0 0.0 - 0.2 %  Wet prep, genital   Collection Time: 03/01/24  8:31 PM   Specimen: Vaginal  Result Value Ref Range   Yeast Wet Prep HPF POC NONE SEEN NONE SEEN   Trich, Wet Prep NONE SEEN NONE SEEN   Clue Cells Wet Prep HPF POC NONE SEEN NONE SEEN    WBC, Wet Prep HPF POC >=10 (A) <10   Sperm NONE SEEN     Treatments: none  Hospital Course:  This is a 29 y.o. G2P1001 with IUP at [redacted]w[redacted]d admitted for diarrhea, cold chills, and abdominal pain.  Ordered IVF bolus - refused. Labs collected. PO hydration given. WBCs were elevated but the same from a month ago. Wet prep and resp panel were negative.  Toco was quiet.  She was observed, fetal heart rate monitoring remained reassuring, and she had no signs/symptoms of UCs or other maternal-fetal concerns.  She was deemed stable for discharge to home with outpatient follow up.  Discharge Physical Exam:  BP 124/78   Pulse (!) 107   Temp 98.2 F (36.8 C) (Oral)    NST: FHR baseline: 140 bpm Variability: moderate Accelerations: yes  10x10 Decelerations: none Category/reactivity: appropriate for GA   TOCO: quiet SVE: deferred      Discharge Condition: Stable  Disposition:  Discharge disposition: 01-Home or Self Care        Allergies as of 03/01/2024       Reactions   Penicillins Rash        Medication List     TAKE these medications    acetaminophen  500 MG tablet Commonly known as: TYLENOL  Take 1 tablet (500 mg total) by mouth every 6 (six) hours  as needed.   aspirin EC 81 MG tablet Take 81 mg by mouth daily. Swallow whole.   diphenhydrAMINE  50 MG tablet Commonly known as: BENADRYL  Take 0.5 tablets (25 mg total) by mouth at bedtime as needed for itching.   loperamide  2 MG tablet Commonly known as: Imodium  A-D Take 1 tablet (2 mg total) by mouth 3 (three) times daily as needed for diarrhea or loose stools.   prenatal multivitamin Tabs tablet Take 1 tablet by mouth daily at 12 noon.        Follow-up Information     [facility] Follow up.   Why: Keep all scheduled appointments Contact information: [address] [place]  [phone]                Signed:  DELON MYRON HOWARD 03/01/2024 9:35 PM
